# Patient Record
Sex: Female | Born: 1955 | Race: White | Hispanic: No | Marital: Married | Smoking: Never smoker
Health system: Southern US, Community
[De-identification: ages and names within clinical notes are randomized; demographics above are authoritative.]

## PROBLEM LIST (undated history)

## (undated) DIAGNOSIS — F419 Anxiety disorder, unspecified: Secondary | ICD-10-CM

## (undated) DIAGNOSIS — R5381 Other malaise: Secondary | ICD-10-CM

## (undated) DIAGNOSIS — R5383 Other fatigue: Secondary | ICD-10-CM

## (undated) DIAGNOSIS — R519 Headache, unspecified: Secondary | ICD-10-CM

## (undated) DIAGNOSIS — R002 Palpitations: Secondary | ICD-10-CM

## (undated) DIAGNOSIS — R51 Headache: Secondary | ICD-10-CM

## (undated) DIAGNOSIS — I959 Hypotension, unspecified: Secondary | ICD-10-CM

## (undated) DIAGNOSIS — F429 Obsessive-compulsive disorder, unspecified: Secondary | ICD-10-CM

## (undated) HISTORY — DX: Obsessive-compulsive disorder, unspecified: F42.9

## (undated) HISTORY — DX: Other malaise: R53.81

## (undated) HISTORY — DX: Other fatigue: R53.83

## (undated) HISTORY — PX: NO PAST SURGERIES: SHX2092

## (undated) HISTORY — DX: Palpitations: R00.2

## (undated) HISTORY — DX: Hypotension, unspecified: I95.9

## (undated) HISTORY — DX: Headache: R51

## (undated) HISTORY — DX: Anxiety disorder, unspecified: F41.9

## (undated) HISTORY — DX: Headache, unspecified: R51.9

---

## 2000-01-13 ENCOUNTER — Emergency Department (HOSPITAL_COMMUNITY): Admission: EM | Admit: 2000-01-13 | Discharge: 2000-01-13 | Payer: Self-pay | Admitting: *Deleted

## 2000-01-16 ENCOUNTER — Encounter (HOSPITAL_COMMUNITY): Admission: RE | Admit: 2000-01-16 | Discharge: 2000-01-16 | Payer: Self-pay | Admitting: Emergency Medicine

## 2000-01-20 ENCOUNTER — Encounter (HOSPITAL_COMMUNITY): Admission: RE | Admit: 2000-01-20 | Discharge: 2000-02-10 | Payer: Self-pay | Admitting: *Deleted

## 2000-04-09 ENCOUNTER — Encounter: Admission: RE | Admit: 2000-04-09 | Discharge: 2000-04-09 | Payer: Self-pay | Admitting: Gynecology

## 2000-04-09 ENCOUNTER — Encounter: Payer: Self-pay | Admitting: Gynecology

## 2000-04-10 ENCOUNTER — Encounter: Admission: RE | Admit: 2000-04-10 | Discharge: 2000-04-10 | Payer: Self-pay | Admitting: Gynecology

## 2000-04-10 ENCOUNTER — Encounter: Payer: Self-pay | Admitting: Gynecology

## 2000-10-20 ENCOUNTER — Other Ambulatory Visit: Admission: RE | Admit: 2000-10-20 | Discharge: 2000-10-20 | Payer: Self-pay | Admitting: Gynecology

## 2001-04-08 ENCOUNTER — Encounter: Admission: RE | Admit: 2001-04-08 | Discharge: 2001-04-08 | Payer: Self-pay | Admitting: Gynecology

## 2001-04-08 ENCOUNTER — Encounter: Payer: Self-pay | Admitting: Gynecology

## 2001-12-01 ENCOUNTER — Other Ambulatory Visit: Admission: RE | Admit: 2001-12-01 | Discharge: 2001-12-01 | Payer: Self-pay | Admitting: Gynecology

## 2002-04-19 ENCOUNTER — Encounter: Payer: Self-pay | Admitting: Gynecology

## 2002-04-19 ENCOUNTER — Encounter: Admission: RE | Admit: 2002-04-19 | Discharge: 2002-04-19 | Payer: Self-pay | Admitting: Gynecology

## 2002-05-10 ENCOUNTER — Ambulatory Visit (HOSPITAL_COMMUNITY): Admission: RE | Admit: 2002-05-10 | Discharge: 2002-05-10 | Payer: Self-pay | Admitting: Gastroenterology

## 2003-02-02 ENCOUNTER — Other Ambulatory Visit: Admission: RE | Admit: 2003-02-02 | Discharge: 2003-02-02 | Payer: Self-pay | Admitting: Gynecology

## 2003-04-21 ENCOUNTER — Encounter: Admission: RE | Admit: 2003-04-21 | Discharge: 2003-04-21 | Payer: Self-pay | Admitting: Gynecology

## 2003-04-21 ENCOUNTER — Encounter: Payer: Self-pay | Admitting: Gynecology

## 2004-01-10 ENCOUNTER — Emergency Department (HOSPITAL_COMMUNITY): Admission: EM | Admit: 2004-01-10 | Discharge: 2004-01-10 | Payer: Self-pay | Admitting: Emergency Medicine

## 2004-02-13 ENCOUNTER — Other Ambulatory Visit: Admission: RE | Admit: 2004-02-13 | Discharge: 2004-02-13 | Payer: Self-pay | Admitting: Gynecology

## 2004-05-14 ENCOUNTER — Encounter: Admission: RE | Admit: 2004-05-14 | Discharge: 2004-05-14 | Payer: Self-pay | Admitting: Gynecology

## 2004-05-17 ENCOUNTER — Encounter: Admission: RE | Admit: 2004-05-17 | Discharge: 2004-05-17 | Payer: Self-pay | Admitting: Gynecology

## 2005-03-06 ENCOUNTER — Other Ambulatory Visit: Admission: RE | Admit: 2005-03-06 | Discharge: 2005-03-06 | Payer: Self-pay | Admitting: Gynecology

## 2005-05-30 ENCOUNTER — Encounter: Admission: RE | Admit: 2005-05-30 | Discharge: 2005-05-30 | Payer: Self-pay | Admitting: Gynecology

## 2006-06-01 ENCOUNTER — Encounter: Admission: RE | Admit: 2006-06-01 | Discharge: 2006-06-01 | Payer: Self-pay | Admitting: Gynecology

## 2007-05-25 ENCOUNTER — Ambulatory Visit: Payer: Self-pay | Admitting: Hematology & Oncology

## 2007-06-25 ENCOUNTER — Encounter: Admission: RE | Admit: 2007-06-25 | Discharge: 2007-06-25 | Payer: Self-pay | Admitting: Gynecology

## 2007-06-30 LAB — CBC WITH DIFFERENTIAL/PLATELET
BASO%: 1.2 % (ref 0.0–2.0)
Basophils Absolute: 0.1 10*3/uL (ref 0.0–0.1)
EOS%: 2.5 % (ref 0.0–7.0)
Eosinophils Absolute: 0.1 10*3/uL (ref 0.0–0.5)
HCT: 41.5 % (ref 34.8–46.6)
HGB: 14.5 g/dL (ref 11.6–15.9)
LYMPH%: 30.7 % (ref 14.0–48.0)
MCH: 30.5 pg (ref 26.0–34.0)
MCHC: 35 g/dL (ref 32.0–36.0)
MCV: 87.1 fL (ref 81.0–101.0)
MONO#: 0.4 10*3/uL (ref 0.1–0.9)
MONO%: 6 % (ref 0.0–13.0)
NEUT#: 3.6 10*3/uL (ref 1.5–6.5)
NEUT%: 59.7 % (ref 39.6–76.8)
Platelets: 236 10*3/uL (ref 145–400)
RBC: 4.76 10*6/uL (ref 3.70–5.32)
RDW: 10.3 % — ABNORMAL LOW (ref 11.3–14.5)
WBC: 6 10*3/uL (ref 3.9–10.0)
lymph#: 1.9 10*3/uL (ref 0.9–3.3)

## 2007-07-02 LAB — HYPERCOAGULABLE PANEL, COMPREHENSIVE
AntiThromb III Func: 103 % (ref 76–126)
Anticardiolipin IgA: 7 [APL'U] (ref <13)
Anticardiolipin IgG: <7 [GPL'U] (ref <11)
Anticardiolipin IgM: <7 [MPL'U] (ref <10)
Beta-2 Glyco I IgG: <4 U/mL (ref <20)
Beta-2-Glycoprotein I IgA: 5 U/mL (ref <10)
Beta-2-Glycoprotein I IgM: <4 U/mL (ref <10)
DRVVT: 38.1 secs (ref 36.1–47.0)
Homocysteine: 5.9 umol/L (ref 4.0–15.4)
PTT Lupus Anticoagulant: 37.8 secs (ref 36.3–48.8)
Protein C Activity: 75 % (ref 75–133)
Protein C, Total: 50 % — ABNORMAL LOW (ref 70–140)
Protein S Activity: 103 % (ref 69–129)
Protein S Ag, Total: 122 % (ref 70–140)

## 2009-01-30 ENCOUNTER — Encounter: Admission: RE | Admit: 2009-01-30 | Discharge: 2009-01-30 | Payer: Self-pay | Admitting: Gynecology

## 2010-03-14 ENCOUNTER — Encounter: Admission: RE | Admit: 2010-03-14 | Discharge: 2010-03-14 | Payer: Self-pay | Admitting: Gynecology

## 2010-09-15 ENCOUNTER — Encounter: Payer: Self-pay | Admitting: Gynecology

## 2011-02-27 ENCOUNTER — Telehealth: Payer: Self-pay | Admitting: Cardiology

## 2011-02-27 NOTE — Telephone Encounter (Signed)
Fax [phone].  Need most recent ov, ekg, echo, stress test.

## 2011-02-28 ENCOUNTER — Encounter: Payer: Self-pay | Admitting: Cardiology

## 2011-02-28 NOTE — Telephone Encounter (Signed)
Records sent to [facility].

## 2011-03-06 ENCOUNTER — Other Ambulatory Visit: Payer: Self-pay | Admitting: *Deleted

## 2011-03-06 MED ORDER — METOPROLOL SUCCINATE ER 50 MG PO TB24: ORAL_TABLET | ORAL | Status: DC

## 2011-03-06 NOTE — Telephone Encounter (Signed)
Fax received from pharmacy. Refill completed. Jodette Kylar Leonhardt RN  

## 2011-04-04 ENCOUNTER — Other Ambulatory Visit: Payer: Self-pay | Admitting: Gynecology

## 2011-04-04 DIAGNOSIS — Z1231 Encounter for screening mammogram for malignant neoplasm of breast: Secondary | ICD-10-CM

## 2011-05-07 ENCOUNTER — Ambulatory Visit
Admission: RE | Admit: 2011-05-07 | Discharge: 2011-05-07 | Disposition: A | Discharge status: Home / Self Care | Payer: PRIVATE HEALTH INSURANCE | Source: Ambulatory Visit | Attending: Gynecology | Admitting: Gynecology

## 2011-05-07 DIAGNOSIS — Z1231 Encounter for screening mammogram for malignant neoplasm of breast: Secondary | ICD-10-CM

## 2011-05-19 ENCOUNTER — Other Ambulatory Visit: Payer: Self-pay | Admitting: Cardiology

## 2011-05-20 ENCOUNTER — Other Ambulatory Visit: Payer: Self-pay | Admitting: *Deleted

## 2011-05-20 MED ORDER — METOPROLOL SUCCINATE ER 50 MG PO TB24: ORAL_TABLET | ORAL | Status: DC

## 2011-05-20 NOTE — Telephone Encounter (Signed)
Called msg to make yearly app, 30 pills given

## 2011-05-29 ENCOUNTER — Ambulatory Visit (INDEPENDENT_AMBULATORY_CARE_PROVIDER_SITE_OTHER): Payer: PRIVATE HEALTH INSURANCE | Admitting: Cardiology

## 2011-05-29 ENCOUNTER — Encounter: Payer: Self-pay | Admitting: Cardiology

## 2011-05-29 VITALS — BP 110/70 | HR 66 | Ht 63.0 in | Wt 128.0 lb

## 2011-05-29 DIAGNOSIS — F429 Obsessive-compulsive disorder, unspecified: Secondary | ICD-10-CM

## 2011-05-29 DIAGNOSIS — R002 Palpitations: Secondary | ICD-10-CM

## 2011-05-29 HISTORY — DX: Palpitations: R00.2

## 2011-05-29 HISTORY — DX: Obsessive-compulsive disorder, unspecified: F42.9

## 2011-05-29 NOTE — Assessment & Plan Note (Signed)
The patient is followed for this disorder by a psychologist in [place], but will probably transfer her care to Dr. Meredith Staggers in [place]

## 2011-05-29 NOTE — Progress Notes (Signed)
Christine [name] Date of Birth:  14-May-1956 [facility] [address]   [address] [place], [place]  [address] [phone]           Fax   [phone]  HPI: This pleasant 55 year old woman is seen for a one-year followup office visit.  Has a history of palpitations and hyperkinetic heart syndrome.  He does not have any significant structural heart disease.  She had an echocardiogram in 2008, which was normal.  He had normal systolic function with an ejection fraction of 55-60% and normal diastolic function.  She had had a prior her stress test, which had suggested a low ejection fraction.  The the echocardiogram showed new wall motion abnormalities.  She did not have any ischemia on her nuclear stress test.  Current Outpatient Prescriptions  Medication Sig Dispense Refill  . ALPRAZolam (XANAX) 0.25 MG tablet Take 0.25 mg by mouth at bedtime as needed.        Marland Kitchen amphetamine-dextroamphetamine (ADDERALL) 15 MG tablet Take 15 mg by mouth daily. Taking 1/2 daily      . DULoxetine (CYMBALTA) 60 MG capsule Take 60 mg by mouth daily.        Marland Kitchen FLUoxetine (PROZAC) 20 MG tablet Take 20 mg by mouth daily.        . metoprolol (TOPROL XL) 50 MG 24 hr tablet Take one half tablet by mouth daily  30 tablet  0    Allergies  Allergen Reactions  . Iodine     topical    Patient Active Problem List  Diagnoses  . OCD (obsessive compulsive disorder)  . Palpitations    History  Smoking status  . Never Smoker   Smokeless tobacco  . Not on file    History  Alcohol Use: Not on file    No family history on file.  Review of Systems: The patient denies any heat or cold intolerance.  No weight gain or weight loss.  The patient denies headaches or blurry vision.  There is no cough or sputum production.  The patient denies dizziness.  There is no hematuria or hematochezia.  The patient denies any muscle aches or arthritis.  The patient denies any rash.  The patient denies  frequent falling or instability.  There is no history of depression or anxiety.  All other systems were reviewed and are negative.   Physical Exam: Filed Vitals:   05/29/11 0941  BP: 110/70  Pulse: 66   general appearance reveals a well-developed, well-nourished woman in no distress.The head and neck exam reveals pupils equal and reactive.  Extraocular movements are full.  There is no scleral icterus.  The mouth and pharynx are normal.  The neck is supple.  The carotids reveal no bruits.  The jugular venous pressure is normal.  The  thyroid is not enlarged.  There is no lymphadenopathy.  The chest is clear to percussion and auscultation.  There are no rales or rhonchi.  Expansion of the chest is symmetrical.  The precordium is quiet.  The first heart sound is normal.  The second heart sound is physiologically split.  There is no murmur gallop rub or click.  There is no abnormal lift or heave.  The abdomen is soft and nontender.  The bowel sounds are normal.  The liver and spleen are not enlarged.  There are no abdominal masses.  There are no abdominal bruits.  Extremities reveal good pedal pulses.  There is no phlebitis or edema.  There is no cyanosis or clubbing.  Strength is normal and symmetrical in all extremities.  There is no lateralizing weakness.  There are no sensory deficits.  The skin is warm and dry.  There is no rash.      Assessment / Plan: Recheck in one year for office visit and EKG

## 2011-05-29 NOTE — Assessment & Plan Note (Signed)
Patient is on Toprol 25 mg each day.  She is not having any palpitations.  She denied any chest pain.

## 2011-05-29 NOTE — Patient Instructions (Signed)
Your physician wants you to follow-up in: 1 year You will receive a reminder letter in the mail two months in advance. If you don't receive a letter, please call our office to schedule the follow-up appointment.  continue same dose of medications   

## 2011-07-31 ENCOUNTER — Other Ambulatory Visit: Payer: Self-pay | Admitting: Cardiology

## 2012-03-30 ENCOUNTER — Other Ambulatory Visit: Payer: Self-pay | Admitting: Gynecology

## 2012-03-30 DIAGNOSIS — Z1231 Encounter for screening mammogram for malignant neoplasm of breast: Secondary | ICD-10-CM

## 2012-04-07 ENCOUNTER — Other Ambulatory Visit: Payer: Self-pay | Admitting: Cardiology

## 2012-04-07 NOTE — Telephone Encounter (Signed)
Refilled metoprolol 

## 2012-05-10 ENCOUNTER — Ambulatory Visit
Admission: RE | Admit: 2012-05-10 | Discharge: 2012-05-10 | Disposition: A | Discharge status: Home / Self Care | Payer: PRIVATE HEALTH INSURANCE | Source: Ambulatory Visit | Attending: Gynecology | Admitting: Gynecology

## 2012-05-10 DIAGNOSIS — Z1231 Encounter for screening mammogram for malignant neoplasm of breast: Secondary | ICD-10-CM

## 2012-07-07 ENCOUNTER — Encounter: Payer: Self-pay | Admitting: Cardiology

## 2012-07-07 ENCOUNTER — Ambulatory Visit (INDEPENDENT_AMBULATORY_CARE_PROVIDER_SITE_OTHER): Payer: PRIVATE HEALTH INSURANCE | Admitting: Cardiology

## 2012-07-07 VITALS — BP 113/75 | HR 61 | Ht 63.6 in | Wt 130.0 lb

## 2012-07-07 DIAGNOSIS — R5381 Other malaise: Secondary | ICD-10-CM

## 2012-07-07 DIAGNOSIS — R002 Palpitations: Secondary | ICD-10-CM

## 2012-07-07 DIAGNOSIS — R5383 Other fatigue: Secondary | ICD-10-CM | POA: Insufficient documentation

## 2012-07-07 DIAGNOSIS — F429 Obsessive-compulsive disorder, unspecified: Secondary | ICD-10-CM

## 2012-07-07 HISTORY — DX: Other malaise: R53.81

## 2012-07-07 LAB — CBC WITH DIFFERENTIAL/PLATELET
Basophils Absolute: 0 10*3/uL (ref 0.0–0.1)
Basophils Relative: 0.4 % (ref 0.0–3.0)
Eosinophils Absolute: 0.1 10*3/uL (ref 0.0–0.7)
Eosinophils Relative: 2.3 % (ref 0.0–5.0)
HCT: 41.7 % (ref 36.0–46.0)
Hemoglobin: 13.8 g/dL (ref 12.0–15.0)
Lymphocytes Relative: 20.1 % (ref 12.0–46.0)
Lymphs Abs: 1.1 10*3/uL (ref 0.7–4.0)
MCHC: 33.1 g/dL (ref 30.0–36.0)
MCV: 91.6 fl (ref 78.0–100.0)
Monocytes Absolute: 0.6 10*3/uL (ref 0.1–1.0)
Monocytes Relative: 11.9 % (ref 3.0–12.0)
Neutro Abs: 3.5 10*3/uL (ref 1.4–7.7)
Neutrophils Relative %: 65.3 % (ref 43.0–77.0)
Platelets: 260 10*3/uL (ref 150.0–400.0)
RBC: 4.56 Mil/uL (ref 3.87–5.11)
RDW: 13.1 % (ref 11.5–14.6)
WBC: 5.3 10*3/uL (ref 4.5–10.5)

## 2012-07-07 LAB — BASIC METABOLIC PANEL
BUN: 13 mg/dL (ref 6–23)
CO2: 27 mEq/L (ref 19–32)
Calcium: 8.9 mg/dL (ref 8.4–10.5)
Chloride: 101 mEq/L (ref 96–112)
Creatinine, Ser: 0.7 mg/dL (ref 0.4–1.2)
GFR: 95.57 mL/min (ref >60.00)
Glucose, Bld: 80 mg/dL (ref 70–99)
Potassium: 4.2 mEq/L (ref 3.5–5.1)
Sodium: 133 mEq/L — ABNORMAL LOW (ref 135–145)

## 2012-07-07 LAB — HEPATIC FUNCTION PANEL
ALT: 15 U/L (ref 0–35)
AST: 16 U/L (ref 0–37)
Albumin: 3.8 g/dL (ref 3.5–5.2)
Alkaline Phosphatase: 50 U/L (ref 39–117)
Bilirubin, Direct: 0.1 mg/dL (ref 0.0–0.3)
Total Bilirubin: 0.9 mg/dL (ref 0.3–1.2)
Total Protein: 7.1 g/dL (ref 6.0–8.3)

## 2012-07-07 LAB — LIPID PANEL
Cholesterol: 219 mg/dL — ABNORMAL HIGH (ref 0–200)
HDL: 50.4 mg/dL (ref >39.00)
Total CHOL/HDL Ratio: 4
Triglycerides: 85 mg/dL (ref 0.0–149.0)
VLDL: 17 mg/dL (ref 0.0–40.0)

## 2012-07-07 LAB — TSH: TSH: 0.98 u[IU]/mL (ref 0.35–5.50)

## 2012-07-07 LAB — LDL CHOLESTEROL, DIRECT: Direct LDL: 151.8 mg/dL

## 2012-07-07 MED ORDER — METOPROLOL SUCCINATE ER 25 MG PO TB24: 12.5000 mg | ORAL_TABLET | Freq: Every day | ORAL | Status: DC

## 2012-07-07 NOTE — Assessment & Plan Note (Signed)
This is followed closely by her psychiatrist Dr. Meredith Staggers

## 2012-07-07 NOTE — Assessment & Plan Note (Signed)
The patient has not been getting much regular exercise.  She wanted to be sure that her heart was strong enough for exercise.  I reassured her today that it was strong enough.  Her electrocardiogram today remains normal.

## 2012-07-07 NOTE — Progress Notes (Signed)
Christine [name] Date of Birth:  [birth year] [facility] [address] [place], [place]  [address] [phone]         Fax   [phone]  History of Present Illness: This pleasant 56 year old woman is seen for a one-year followup office visit. Has a history of palpitations and hyperkinetic heart syndrome. He does not have any significant structural heart disease. She had an echocardiogram in 2008, which was normal. He had normal systolic function with an ejection fraction of 55-60% and normal diastolic function. She had had a prior nuclear stress test, which had suggested a low ejection fraction. The the echocardiogram showed new wall motion abnormalities. She did not have any ischemia on her nuclear stress test.  Since last visit she has been doing well.  She does have a history of OCD.  Her recent medications have been changed by her psychiatrist.   Current Outpatient Prescriptions  Medication Sig Dispense Refill  . Estradiol (MINIVELLE TD) Place 1 patch onto the skin 2 (two) times a week.      Marland Kitchen FLUoxetine (PROZAC) 20 MG tablet Take 60 mg by mouth daily.       . metoprolol succinate (TOPROL-XL) 25 MG 24 hr tablet Take 0.5 tablets (12.5 mg total) by mouth daily. Take with or immediately following a meal.  45 tablet  3  . progesterone 200 MG SUPP Place 200 mg vaginally once. 1 time every three months      . [DISCONTINUED] metoprolol succinate (TOPROL-XL) 50 MG 24 hr tablet take 1/2 tablet by mouth once daily  30 tablet  3    Allergies  Allergen Reactions  . Iodine     topical    Patient Active Problem List  Diagnosis  . OCD (obsessive compulsive disorder)  . Palpitations  . Malaise and fatigue    History  Smoking status  . Never Smoker   Smokeless tobacco  . Not on file    History  Alcohol Use: Not on file    No family history on file.  Review of Systems: Constitutional: no fever chills diaphoresis or fatigue or change in weight.  Head and neck:  no hearing loss, no epistaxis, no photophobia or visual disturbance. Respiratory: No cough, shortness of breath or wheezing. Cardiovascular: No chest pain peripheral edema, palpitations. Gastrointestinal: No abdominal distention, no abdominal pain, no change in bowel habits hematochezia or melena. Genitourinary: No dysuria, no frequency, no urgency, no nocturia. Musculoskeletal:No arthralgias, no back pain, no gait disturbance or myalgias. Neurological: No dizziness, no headaches, no numbness, no seizures, no syncope, no weakness, no tremors. Hematologic: No lymphadenopathy, no easy bruising. Psychiatric: No confusion, no hallucinations, no sleep disturbance.    Physical Exam: Filed Vitals:   07/07/12 1132  BP: 113/75  Pulse: 61   the general appearance reveals a well-developed slightly anxious woman in no acute distress.The head and neck exam reveals pupils equal and reactive.  Extraocular movements are full.  There is no scleral icterus.  The mouth and pharynx are normal.  The neck is supple.  The carotids reveal no bruits.  The jugular venous pressure is normal.  The  thyroid is not enlarged.  There is no lymphadenopathy.  The chest is clear to percussion and auscultation.  There are no rales or rhonchi.  Expansion of the chest is symmetrical.  The precordium is quiet.  The first heart sound is normal.  The second heart sound is physiologically split.  There is no murmur gallop rub or click.  There is no abnormal lift or heave.  The abdomen is soft and nontender.  The bowel sounds are normal.  The liver and spleen are not enlarged.  There are no abdominal masses.  There are no abdominal bruits.  Extremities reveal good pedal pulses.  There is no phlebitis or edema.  There is no cyanosis or clubbing.  Strength is normal and symmetrical in all extremities.  There is no lateralizing weakness.  There are no sensory deficits.  The skin is warm and dry.  There is no rash.     Assessment /  Plan: Continue same medication except decreased dose of Toprol because of potential interaction with Prozac.  Get fasting lab work today.  Recheck in 6 months for followup office visit and EKG

## 2012-07-07 NOTE — Assessment & Plan Note (Addendum)
The patient complains of lack of energy.  She is now on high-dose Prozac 60 mg daily.  There is a potential interaction with metoprolol.  For this reason we will reduce her Toprol-XL to just half of a 25 mg tablet daily.  Her EKG today shows resting sinus bradycardia of 56.  She has not had any recent lab work and we will look for other causes of malaise and fatigue such as anemia and underactive thyroid and electrolyte imbalance etc.

## 2012-07-07 NOTE — Patient Instructions (Signed)
Your physician recommends that you return for lab work in: cbc/ tsh/ bmet/ hfp/lp  Your physician has recommended you make the following change in your medication:   Reduce toprol xl  to 12.5 mg daily  Your physician wants you to follow-up in: 6 months  You will receive a reminder letter in the mail two months in advance. If you don't receive a letter, please call our office to schedule the follow-up appointment.

## 2012-07-07 NOTE — Progress Notes (Signed)
Quick Note:  Please report to patient. The recent labs are stable. Continue same medication and careful diet. No anemia. Thyroid normal. LDL cholesterol is high. Watch diet, increase exercise. ______

## 2012-07-09 ENCOUNTER — Telehealth: Payer: Self-pay | Admitting: Cardiology

## 2012-07-09 NOTE — Telephone Encounter (Signed)
Advised patient of lab results  

## 2012-07-09 NOTE — Telephone Encounter (Signed)
Message copied by Burnell Blanks on Fri Jul 09, 2012  5:22 PM ------      Message from: Cassell Clement      Created: Wed Jul 07, 2012  8:11 PM       Please report to patient.  The recent labs are stable. Continue same medication and careful diet. No anemia.  Thyroid normal. LDL cholesterol is high. Watch diet, increase exercise.

## 2012-07-09 NOTE — Telephone Encounter (Signed)
Pt calling for blood test results °

## 2013-02-03 ENCOUNTER — Ambulatory Visit (INDEPENDENT_AMBULATORY_CARE_PROVIDER_SITE_OTHER): Payer: BC Managed Care – PPO | Admitting: Cardiology

## 2013-02-03 ENCOUNTER — Encounter: Payer: Self-pay | Admitting: Cardiology

## 2013-02-03 VITALS — BP 118/64 | HR 53 | Ht 63.5 in | Wt 126.8 lb

## 2013-02-03 DIAGNOSIS — F429 Obsessive-compulsive disorder, unspecified: Secondary | ICD-10-CM

## 2013-02-03 DIAGNOSIS — R002 Palpitations: Secondary | ICD-10-CM

## 2013-02-03 NOTE — Assessment & Plan Note (Signed)
Her symptoms of OCD are currently well controlled on present level of Prozac 60 mg daily and she is followed by psychiatry.

## 2013-02-03 NOTE — Assessment & Plan Note (Signed)
Palpitations are well controlled at current level of therapy.  Continue Toprol 12.5 mg daily

## 2013-02-03 NOTE — Progress Notes (Signed)
Christine [name] Date of Birth:  1956/06/18 [facility] [address] [place], [place]  [address] [phone]  Fax   [phone]  HPI: This pleasant 57 year old woman is seen for a six-month followup office visit.  She has a history of palpitations and hyperkinetic heart syndrome.  She does not have any significant structural heart disease. She had an echocardiogram in 2008, which was normal. He had normal systolic function with an ejection fraction of 55-60% and normal diastolic function. She had had a prior nuclear stress test, which had suggested a low ejection fraction. The the echocardiogram showed no wall motion abnormalities. She did not have any ischemia on her nuclear stress test. Since last visit she has been doing well. She does have a history of OCD.  At her last visit we decreased her metoprolol to 12.5 mg daily and she has felt well on the lower dose with no increase in palpitations.   Current Outpatient Prescriptions  Medication Sig Dispense Refill  . Estradiol (MINIVELLE TD) Place 1 patch onto the skin 2 (two) times a week.      Marland Kitchen FLUoxetine (PROZAC) 20 MG tablet Take 60 mg by mouth daily.       . metoprolol succinate (TOPROL-XL) 25 MG 24 hr tablet Take 0.5 tablets (12.5 mg total) by mouth daily. Take with or immediately following a meal.  45 tablet  3  . progesterone 200 MG SUPP Place 200 mg vaginally once. 1 time every three months       No current facility-administered medications for this visit.    Allergies  Allergen Reactions  . Iodine     topical    Patient Active Problem List   Diagnosis Date Noted  . Malaise and fatigue 07/07/2012  . OCD (obsessive compulsive disorder) 05/29/2011  . Palpitations 05/29/2011    History  Smoking status  . Never Smoker   Smokeless tobacco  . Not on file    History  Alcohol Use: Not on file    No family history on file.  Review of Systems: The patient denies any heat or cold intolerance.  No  weight gain or weight loss.  The patient denies headaches or blurry vision.  There is no cough or sputum production.  The patient denies dizziness.  There is no hematuria or hematochezia.  The patient denies any muscle aches or arthritis.  The patient denies any rash.  The patient denies frequent falling or instability.  There is no history of depression or anxiety.  All other systems were reviewed and are negative.   Physical Exam: Filed Vitals:   02/03/13 1028  BP: 118/64  Pulse: 53   the general appearance reveals a well-developed well-nourished woman in no distress.The head and neck exam reveals pupils equal and reactive.  Extraocular movements are full.  There is no scleral icterus.  The mouth and pharynx are normal.  The neck is supple.  The carotids reveal no bruits.  The jugular venous pressure is normal.  The  thyroid is not enlarged.  There is no lymphadenopathy.  The chest is clear to percussion and auscultation.  There are no rales or rhonchi.  Expansion of the chest is symmetrical.  The precordium is quiet.  The first heart sound is normal.  The second heart sound is physiologically split.  There is no murmur gallop rub or click.  There is no abnormal lift or heave.  The abdomen is soft and nontender.  The bowel sounds are normal.  The liver and spleen are not enlarged.  There are no abdominal masses.  There are no abdominal bruits.  Extremities reveal good pedal pulses.  There is no phlebitis or edema.  There is no cyanosis or clubbing.  Strength is normal and symmetrical in all extremities.  There is no lateralizing weakness.  There are no sensory deficits.  The skin is warm and dry.  There is no rash.  EKG shows sinus bradycardia and no ischemic changes.    Assessment / Plan: Continue same dose of medication.  Recheck in one year for followup office visit and EKG.

## 2013-02-03 NOTE — Patient Instructions (Signed)
Your physician recommends that you continue on your current medications as directed. Please refer to the Current Medication list given to you today.   Your physician wants you to follow-up in:1 YEAR  You will receive a reminder letter in the mail two months in advance. If you don't receive a letter, please call our office to schedule the follow-up appointment.  

## 2013-05-09 ENCOUNTER — Other Ambulatory Visit: Payer: Self-pay

## 2013-05-09 DIAGNOSIS — Z1231 Encounter for screening mammogram for malignant neoplasm of breast: Secondary | ICD-10-CM

## 2013-05-31 ENCOUNTER — Ambulatory Visit
Admission: RE | Admit: 2013-05-31 | Discharge: 2013-05-31 | Disposition: A | Discharge status: Home / Self Care | Payer: BC Managed Care – PPO | Source: Ambulatory Visit

## 2013-05-31 DIAGNOSIS — Z1231 Encounter for screening mammogram for malignant neoplasm of breast: Secondary | ICD-10-CM

## 2013-07-17 ENCOUNTER — Other Ambulatory Visit: Payer: Self-pay | Admitting: Cardiology

## 2014-02-22 ENCOUNTER — Encounter: Payer: Self-pay | Admitting: Cardiology

## 2014-02-22 ENCOUNTER — Ambulatory Visit (INDEPENDENT_AMBULATORY_CARE_PROVIDER_SITE_OTHER): Payer: BC Managed Care – PPO | Admitting: Cardiology

## 2014-02-22 VITALS — BP 95/63 | HR 58 | Ht 63.5 in | Wt 123.0 lb

## 2014-02-22 DIAGNOSIS — R5383 Other fatigue: Secondary | ICD-10-CM

## 2014-02-22 DIAGNOSIS — R5381 Other malaise: Secondary | ICD-10-CM

## 2014-02-22 DIAGNOSIS — I9589 Other hypotension: Secondary | ICD-10-CM

## 2014-02-22 DIAGNOSIS — F429 Obsessive-compulsive disorder, unspecified: Secondary | ICD-10-CM

## 2014-02-22 DIAGNOSIS — R002 Palpitations: Secondary | ICD-10-CM

## 2014-02-22 NOTE — Patient Instructions (Addendum)
Increase your salt and water intake  Your physician wants you to follow-up in: 1 year ov You will receive a reminder letter in the mail two months in advance. If you don't receive a letter, please call our office to schedule the follow-up appointment.   Your physician recommends that you continue on your current medications as directed. Please refer to the Current Medication list given to you today.

## 2014-02-22 NOTE — Assessment & Plan Note (Signed)
Her blood pressure today was low.  Every checked it and it was unchanged in the range of 95 systolic.  We will have her increase her dietary salt intake and be sure she drinks plenty of water to help raise her blood pressure.  We cannot go any lower on her metoprolol.

## 2014-02-22 NOTE — Progress Notes (Signed)
Christine JacobSallie G [name] Date of Birth:  Feb 19, 1956 [facility] [address] [place], [place]  [id number] [phone]  Fax   [phone]  HPI: This pleasant 58 year old woman is seen for a one-year followup office visit. She has a history of palpitations and hyperkinetic heart syndrome. She does not have any significant structural heart disease. She had an echocardiogram in 2008, which was normal. He had normal systolic function with an ejection fraction of 55-60% and normal diastolic function. She had had a prior nuclear stress test, which had suggested a low ejection fraction. The the echocardiogram showed no wall motion abnormalities. She did not have any ischemia on her nuclear stress test. Since last visit she has been doing well. She does have a history of OCD. At her last visit we decreased her metoprolol to 12.5 mg daily and she has felt well on the lower dose with no increase in palpitations.   Current Outpatient Prescriptions  Medication Sig Dispense Refill  . Estradiol (MINIVELLE TD) Place 1 patch onto the skin 2 (two) times a week.      Marland Kitchen. FLUoxetine (PROZAC) 10 MG capsule Take 10 mg by mouth every evening. TOTAL OF 70 MG IN THE EVENING      . FLUoxetine (PROZAC) 20 MG tablet Take 60 mg by mouth every evening. TOTAL OF 70 MG IN THE EVENING      . metoprolol succinate (TOPROL-XL) 25 MG 24 hr tablet take 1/2 tablet by mouth once daily in the evening      . progesterone (PROMETRIUM) 200 MG capsule Take 200 mg by mouth as directed. Take 1 capsule for 12 days daily every other month       No current facility-administered medications for this visit.    Allergies  Allergen Reactions  . Iodine     topical    Patient Active Problem List   Diagnosis Date Noted  . Malaise and fatigue 07/07/2012  . OCD (obsessive compulsive disorder) 05/29/2011  . Palpitations 05/29/2011    History  Smoking status  . Never Smoker   Smokeless tobacco  . Not on file    History    Alcohol Use: Not on file    No family history on file.  Review of Systems: The patient denies any heat or cold intolerance.  No weight gain or weight loss.  The patient denies headaches or blurry vision.  There is no cough or sputum production.  The patient denies dizziness.  There is no hematuria or hematochezia.  The patient denies any muscle aches or arthritis.  The patient denies any rash.  The patient denies frequent falling or instability.  There is no history of depression or anxiety.  All other systems were reviewed and are negative.   Physical Exam: Filed Vitals:   02/22/14 0933  BP: 95/63  Pulse: 58   the general appearance reveals a well-developed well-nourished pleasant woman in no distress.The head and neck exam reveals pupils equal and reactive.  Extraocular movements are full.  There is no scleral icterus.  The mouth and pharynx are normal.  The neck is supple.  The carotids reveal no bruits.  The jugular venous pressure is normal.  The  thyroid is not enlarged.  There is no lymphadenopathy.  The chest is clear to percussion and auscultation.  There are no rales or rhonchi.  Expansion of the chest is symmetrical.  The precordium is quiet.  The first heart sound is normal.  The second heart  sound is physiologically split.  There is no murmur gallop rub or click.  There is no abnormal lift or heave.  The abdomen is soft and nontender.  The bowel sounds are normal.  The liver and spleen are not enlarged.  There are no abdominal masses.  There are no abdominal bruits.  Extremities reveal good pedal pulses.  There is no phlebitis or edema.  There is no cyanosis or clubbing.  Strength is normal and symmetrical in all extremities.  There is no lateralizing weakness.  There are no sensory deficits.  The skin is warm and dry.  There is no rash.      Assessment / Plan: 1. palpitations and hyperkinetic heart syndrome--continue beta blocker 2. low blood pressure and malaise and fatigue.   We will have her try to increase her dietary salt intake. 3. OCD  Plan: Recheck in one year for followup office visit and EKG

## 2014-02-22 NOTE — Assessment & Plan Note (Signed)
Her symptoms of OCD have remained stable.  She takes all of her medications at night.

## 2014-02-22 NOTE — Assessment & Plan Note (Signed)
She has not had any sustained palpitations or tachycardia

## 2014-04-14 ENCOUNTER — Other Ambulatory Visit: Payer: Self-pay | Admitting: Cardiology

## 2014-05-26 ENCOUNTER — Other Ambulatory Visit: Payer: Self-pay

## 2014-05-26 DIAGNOSIS — Z1231 Encounter for screening mammogram for malignant neoplasm of breast: Secondary | ICD-10-CM

## 2014-06-16 ENCOUNTER — Ambulatory Visit
Admission: RE | Admit: 2014-06-16 | Discharge: 2014-06-16 | Disposition: A | Discharge status: Home / Self Care | Payer: BC Managed Care – PPO | Source: Ambulatory Visit

## 2014-06-16 DIAGNOSIS — Z1231 Encounter for screening mammogram for malignant neoplasm of breast: Secondary | ICD-10-CM

## 2015-02-13 ENCOUNTER — Other Ambulatory Visit: Payer: Self-pay | Admitting: Cardiology

## 2015-02-23 ENCOUNTER — Ambulatory Visit (INDEPENDENT_AMBULATORY_CARE_PROVIDER_SITE_OTHER): Payer: BLUE CROSS/BLUE SHIELD | Admitting: Cardiology

## 2015-02-23 ENCOUNTER — Encounter: Payer: Self-pay | Admitting: Cardiology

## 2015-02-23 VITALS — BP 92/60 | HR 57 | Ht 63.5 in | Wt 126.8 lb

## 2015-02-23 DIAGNOSIS — R5383 Other fatigue: Secondary | ICD-10-CM

## 2015-02-23 DIAGNOSIS — I959 Hypotension, unspecified: Secondary | ICD-10-CM | POA: Diagnosis not present

## 2015-02-23 DIAGNOSIS — R002 Palpitations: Secondary | ICD-10-CM

## 2015-02-23 DIAGNOSIS — F42 Obsessive-compulsive disorder: Secondary | ICD-10-CM

## 2015-02-23 DIAGNOSIS — F429 Obsessive-compulsive disorder, unspecified: Secondary | ICD-10-CM

## 2015-02-23 DIAGNOSIS — R5381 Other malaise: Secondary | ICD-10-CM | POA: Diagnosis not present

## 2015-02-23 HISTORY — DX: Hypotension, unspecified: I95.9

## 2015-02-23 MED ORDER — METOPROLOL SUCCINATE ER 25 MG PO TB24: ORAL_TABLET | ORAL | Status: DC

## 2015-02-23 NOTE — Progress Notes (Signed)
Cardiology Office Note   Date:  02/23/2015   ID:  Christine JacobSallie G Keshishyan, DOB 05/14/1956, MRN [id number]  PCP:  Gweneth DimitriMCNEILL,WENDY, MD  Cardiologist: Cassell Clementhomas Lesa Vandall MD  No chief complaint on file.     History of Present Illness: Christine [name] is a 59 y.o. female who presents for a one-year follow-up visit.  . She has a history of palpitations and hyperkinetic heart syndrome. She does not have any significant structural heart disease. She had an echocardiogram in 2008, which was normal. He had normal systolic function with an ejection fraction of 55-60% and normal diastolic function. She had had a prior nuclear stress test, which had suggested a low ejection fraction. The the echocardiogram showed no wall motion abnormalities. She did not have any ischemia on her nuclear stress test. Since last visit she has been doing well. She does have a history of OCD. At her last visit we decreased her metoprolol to 12.5 mg daily and she has felt well on the lower dose with no increase in palpitations. She gets her exercise by walking her dog.  She also does exercise tapes at home. History of chronic low blood pressure.  She really is not experiencing any dizziness or syncope.  She is trying to eat more salt. Her malaise and fatigue have improved. Prozac is managed by her psychiatrist Dr. Haywood Lassoaudle No past medical history on file.  No past surgical history on file.   Current Outpatient Prescriptions  Medication Sig Dispense Refill  . Estradiol (MINIVELLE TD) Place 1 patch onto the skin 2 (two) times a week.    Marland Kitchen. FLUoxetine (PROZAC) 20 MG tablet Take 60 mg by mouth daily.    . metoprolol succinate (TOPROL-XL) 25 MG 24 hr tablet take 1/2 tablet by mouth once daily TAKE WITH OR IMMEDIATELY FOLLOWING A MEAL 45 tablet 2  . progesterone (PROMETRIUM) 200 MG capsule Take 200 mg by mouth as directed. Take 1 capsule for 12 days daily every other month     No current facility-administered medications for this visit.      Allergies:   Iodine    Social History:  The patient  reports that she has never smoked. She does not have any smokeless tobacco history on file.   Family History:  The patient's family history is not on file. her brother has WPW   ROS:  Please see the history of present illness.   Otherwise, review of systems are positive for none.   All other systems are reviewed and negative.    PHYSICAL EXAM: VS:  BP 92/60 mmHg  Pulse 57  Ht 5' 3.5" (1.613 m)  Wt 126 lb 12.8 oz (57.516 kg)  BMI 22.11 kg/m2 , BMI Body mass index is 22.11 kg/(m^2). GEN: Well nourished, well developed, in no acute distress HEENT: normal Neck: no JVD, carotid bruits, or masses Cardiac: RRR; no murmurs, rubs, or gallops,no edema  Respiratory:  clear to auscultation bilaterally, normal work of breathing GI: soft, nontender, nondistended, + BS MS: no deformity or atrophy Skin: warm and dry, no rash Neuro:  Strength and sensation are intact Psych: euthymic mood, full affect   EKG:  EKG is ordered today. The ekg ordered today demonstrates normal sinus rhythm.  Within normal limits.   Recent Labs: No results found for requested labs within last 365 days.    Lipid Panel    Component Value Date/Time   CHOL 219* 07/07/2012 1222   TRIG 85.0 07/07/2012 1222   HDL 50.40 07/07/2012  1222   CHOLHDL 4 07/07/2012 1222   VLDL 17.0 07/07/2012 1222   LDLDIRECT 151.8 07/07/2012 1222      Wt Readings from Last 3 Encounters:  02/23/15 126 lb 12.8 oz (57.516 kg)  02/22/14 123 lb (55.792 kg)  02/03/13 126 lb 12.8 oz (57.516 kg)         ASSESSMENT AND PLAN:  1. palpitations and hyperkinetic heart syndrome--continue beta blocker 2. low blood pressure and malaise and fatigue.  Improved on high salt intake 3. OCD  Plan: Continue current medication.  Recheck in one year for followup office visit and EKG   Current medicines are reviewed at length with the patient today.  The patient does not have concerns  regarding medicines.  The following changes have been made:  no change  Labs/ tests ordered today include:  No orders of the defined types were placed in this encounter.       Karie Schwalbe MD 02/23/2015 10:22 AM    [facility] [address] [place], [place], [place]  [address] Phone: [phone]; Fax: [phone]

## 2015-02-23 NOTE — Patient Instructions (Signed)
Medication Instructions:  Your physician recommends that you continue on your current medications as directed. Please refer to the Current Medication list given to you today.  Labwork: none  Testing/Procedures: none  Follow-Up: Your physician wants you to follow-up in: 1 year ov/ekg  You will receive a reminder letter in the mail two months in advance. If you don't receive a letter, please call our office to schedule the follow-up appointment.   Any Other Special Instructions Will Be Listed Below (If Applicable).   

## 2015-05-15 ENCOUNTER — Other Ambulatory Visit: Payer: Self-pay

## 2015-05-15 DIAGNOSIS — Z1231 Encounter for screening mammogram for malignant neoplasm of breast: Secondary | ICD-10-CM

## 2015-06-18 ENCOUNTER — Ambulatory Visit
Admission: RE | Admit: 2015-06-18 | Discharge: 2015-06-18 | Disposition: A | Discharge status: Home / Self Care | Payer: BLUE CROSS/BLUE SHIELD | Source: Ambulatory Visit

## 2015-06-18 DIAGNOSIS — Z1231 Encounter for screening mammogram for malignant neoplasm of breast: Secondary | ICD-10-CM

## 2015-11-27 DIAGNOSIS — Z87440 Personal history of urinary (tract) infections: Secondary | ICD-10-CM | POA: Diagnosis not present

## 2015-12-21 DIAGNOSIS — F422 Mixed obsessional thoughts and acts: Secondary | ICD-10-CM | POA: Diagnosis not present

## 2016-01-24 DIAGNOSIS — F422 Mixed obsessional thoughts and acts: Secondary | ICD-10-CM | POA: Diagnosis not present

## 2016-02-12 DIAGNOSIS — F422 Mixed obsessional thoughts and acts: Secondary | ICD-10-CM | POA: Diagnosis not present

## 2016-03-17 DIAGNOSIS — Z1211 Encounter for screening for malignant neoplasm of colon: Secondary | ICD-10-CM | POA: Diagnosis not present

## 2016-03-17 DIAGNOSIS — Z8371 Family history of colonic polyps: Secondary | ICD-10-CM | POA: Diagnosis not present

## 2016-03-21 DIAGNOSIS — R309 Painful micturition, unspecified: Secondary | ICD-10-CM | POA: Diagnosis not present

## 2016-03-21 DIAGNOSIS — N390 Urinary tract infection, site not specified: Secondary | ICD-10-CM | POA: Diagnosis not present

## 2016-04-04 DIAGNOSIS — D225 Melanocytic nevi of trunk: Secondary | ICD-10-CM | POA: Diagnosis not present

## 2016-04-04 DIAGNOSIS — D224 Melanocytic nevi of scalp and neck: Secondary | ICD-10-CM | POA: Diagnosis not present

## 2016-04-04 DIAGNOSIS — L821 Other seborrheic keratosis: Secondary | ICD-10-CM | POA: Diagnosis not present

## 2016-04-04 DIAGNOSIS — L570 Actinic keratosis: Secondary | ICD-10-CM | POA: Diagnosis not present

## 2016-04-04 DIAGNOSIS — D1801 Hemangioma of skin and subcutaneous tissue: Secondary | ICD-10-CM | POA: Diagnosis not present

## 2016-04-07 ENCOUNTER — Other Ambulatory Visit: Payer: Self-pay

## 2016-04-07 MED ORDER — METOPROLOL SUCCINATE ER 25 MG PO TB24: ORAL_TABLET | ORAL | 0 refills | Status: DC

## 2016-04-22 DIAGNOSIS — F422 Mixed obsessional thoughts and acts: Secondary | ICD-10-CM | POA: Diagnosis not present

## 2016-05-09 DIAGNOSIS — R002 Palpitations: Secondary | ICD-10-CM | POA: Diagnosis not present

## 2016-05-09 DIAGNOSIS — Z23 Encounter for immunization: Secondary | ICD-10-CM | POA: Diagnosis not present

## 2016-05-09 DIAGNOSIS — Z0000 Encounter for general adult medical examination without abnormal findings: Secondary | ICD-10-CM | POA: Diagnosis not present

## 2016-05-09 DIAGNOSIS — R5383 Other fatigue: Secondary | ICD-10-CM | POA: Diagnosis not present

## 2016-05-23 ENCOUNTER — Encounter: Payer: Self-pay | Admitting: Cardiology

## 2016-05-23 ENCOUNTER — Ambulatory Visit (INDEPENDENT_AMBULATORY_CARE_PROVIDER_SITE_OTHER): Payer: BLUE CROSS/BLUE SHIELD | Admitting: Cardiology

## 2016-05-23 VITALS — BP 116/74 | HR 55 | Ht 63.0 in | Wt 134.4 lb

## 2016-05-23 DIAGNOSIS — R002 Palpitations: Secondary | ICD-10-CM | POA: Diagnosis not present

## 2016-05-23 MED ORDER — METOPROLOL SUCCINATE ER 25 MG PO TB24: ORAL_TABLET | ORAL | 3 refills | Status: DC

## 2016-05-23 NOTE — Progress Notes (Signed)
Cardiology Office Note    Date:  05/23/2016   ID:  Christine [name], DOB 1956-07-27, MRN [id number]  PCP:  Christine Dimitri, MD  Cardiologist:   Christine Schultz, MD (Former Christine [name])    History of Present Illness:  Christine [name] is a 60 y.o. female for patient of Christine [name] with palpitations here for follow up.   She is doing very well. No palpitations. Tolerating low-dose metoprolol well. No syncope, bleeding, orthopnea, PND, chest pain, shortness of breath. Her daughter and grandchildren live in [place] over the last several years. She is going to visit.  Christine [name] was normal. NUC normal. OCD - Christine [name].  Low dose metoprolol.     Past Medical History:  Diagnosis Date  . Low blood pressure 02/23/2015  . Malaise and fatigue 07/07/2012  . OCD (obsessive compulsive disorder) 05/29/2011  . Palpitations 05/29/2011    Past Surgical History:  Procedure Laterality Date  . NO PAST SURGERIES      Current Medications: Outpatient Medications Prior to Visit  Medication Sig Dispense Refill  . Estradiol (MINIVELLE TD) Place 1 patch onto the skin 2 (two) times a week.    Marland Kitchen FLUoxetine (PROZAC) 20 MG tablet Take 60 mg by mouth daily.    . progesterone (PROMETRIUM) 200 MG capsule Take 200 mg by mouth as directed. Take 1 capsule for 12 days daily every other month    . metoprolol succinate (TOPROL-XL) 25 MG 24 hr tablet take 1/2 tablet by mouth once daily TAKE WITH OR IMMEDIATELY FOLLOWING A MEAL 45 tablet 0   No facility-administered medications prior to visit.      Allergies:   Iodine   Social History   Social History  . Marital status: Married    Spouse name: N/A  . Number of children: N/A  . Years of education: N/A   Social History Main Topics  . Smoking status: Never Smoker  . Smokeless tobacco: None  . Alcohol use None  . Drug use: Unknown  . Sexual activity: Not Asked   Other Topics Concern  . None   Social History Narrative  . None     Family History:  The  patient's no early CAD. Father had PE.   ROS:   Please see the history of present illness.    ROS All other systems reviewed and are negative.   PHYSICAL EXAM:   VS:  BP 116/74   Pulse (!) 55   Ht 5\' 3"  (1.6 m)   Wt 134 lb 6.4 oz (61 kg)   LMP  (LMP Unknown)   BMI 23.81 kg/m    GEN: Well nourished, well developed, in no acute distress  HEENT: normal  Neck: no JVD, carotid bruits, or masses Cardiac: RRR; no murmurs, rubs, or gallops,no edema  Respiratory:  clear to auscultation bilaterally, normal work of breathing GI: soft, nontender, nondistended, + BS MS: no deformity or atrophy  Skin: warm and dry, no rash Neuro:  Alert and Oriented x 3, Strength and sensation are intact Psych: euthymic mood, full affect  Wt Readings from Last 3 Encounters:  05/23/16 134 lb 6.4 oz (61 kg)  02/23/15 126 lb 12.8 oz (57.5 kg)  02/22/14 123 lb (55.8 kg)      Studies/Labs Reviewed:   EKG:  EKG is ordered today.  05/23/16-sinus bradycardia rate 56 with no other abnormalities. Recent Labs: No results found for requested labs within last 8760 hours.   Lipid Panel    Component Value Date/Time  CHOL 219 (H) 07/07/2012 1222   TRIG 85.0 07/07/2012 1222   HDL 50.40 07/07/2012 1222   CHOLHDL 4 07/07/2012 1222   VLDL 17.0 07/07/2012 1222   LDLDIRECT 151.8 07/07/2012 1222    Additional studies/ records that were reviewed today include:  Prior office notes reviewed. Labs reviewed.     ASSESSMENT:    1. Heart palpitations      PLAN:  In order of problems listed above:  Palpitations  - low dose metoprolol, No recent issues. Doing very well.  Hypotension  - low BP, increased salt, Stable doing well.      Medication Adjustments/Labs and Tests Ordered: Current medicines are reviewed at length with the patient today.  Concerns regarding medicines are outlined above.  Medication changes, Labs and Tests ordered today are listed in the Patient Instructions below. Patient  Instructions  Medication Instructions:  The current medical regimen is effective;  continue present plan and medications.  Follow-Up: Follow up in 1 year with Dr. Anne [name].  You will receive a letter in the mail 2 months before you are due.  Please call us when you receive this letter to schedule your follow up appointment.  If you need a refill on your cardiac medications before your next appointment, please call your pharmacy.  Thank you for choosing [name]!!        Signed, Christine SchultzMark Skains, MD  05/23/2016 10:53 AM    [name] [address], [place], [place]  [address] Phone: [phone]) [phone]; Fax: [phone]) [phone]

## 2016-05-23 NOTE — Patient Instructions (Signed)

## 2016-06-09 ENCOUNTER — Other Ambulatory Visit: Payer: Self-pay | Admitting: Gynecology

## 2016-06-09 DIAGNOSIS — F422 Mixed obsessional thoughts and acts: Secondary | ICD-10-CM | POA: Diagnosis not present

## 2016-06-09 DIAGNOSIS — Z1231 Encounter for screening mammogram for malignant neoplasm of breast: Secondary | ICD-10-CM

## 2016-06-24 DIAGNOSIS — Z23 Encounter for immunization: Secondary | ICD-10-CM | POA: Diagnosis not present

## 2016-06-26 ENCOUNTER — Ambulatory Visit
Admission: RE | Admit: 2016-06-26 | Discharge: 2016-06-26 | Disposition: A | Discharge status: Home / Self Care | Payer: BLUE CROSS/BLUE SHIELD | Source: Ambulatory Visit | Attending: Gynecology | Admitting: Gynecology

## 2016-06-26 DIAGNOSIS — Z1231 Encounter for screening mammogram for malignant neoplasm of breast: Secondary | ICD-10-CM

## 2016-07-21 DIAGNOSIS — F422 Mixed obsessional thoughts and acts: Secondary | ICD-10-CM | POA: Diagnosis not present

## 2016-07-24 DIAGNOSIS — Z23 Encounter for immunization: Secondary | ICD-10-CM | POA: Diagnosis not present

## 2016-08-28 DIAGNOSIS — J329 Chronic sinusitis, unspecified: Secondary | ICD-10-CM | POA: Diagnosis not present

## 2016-09-08 DIAGNOSIS — F422 Mixed obsessional thoughts and acts: Secondary | ICD-10-CM | POA: Diagnosis not present

## 2016-10-02 DIAGNOSIS — F422 Mixed obsessional thoughts and acts: Secondary | ICD-10-CM | POA: Diagnosis not present

## 2016-10-06 DIAGNOSIS — M79669 Pain in unspecified lower leg: Secondary | ICD-10-CM | POA: Diagnosis not present

## 2016-10-27 DIAGNOSIS — F422 Mixed obsessional thoughts and acts: Secondary | ICD-10-CM | POA: Diagnosis not present

## 2016-11-13 DIAGNOSIS — R8290 Unspecified abnormal findings in urine: Secondary | ICD-10-CM | POA: Diagnosis not present

## 2016-11-13 DIAGNOSIS — Z79890 Hormone replacement therapy (postmenopausal): Secondary | ICD-10-CM | POA: Diagnosis not present

## 2016-11-13 DIAGNOSIS — Z780 Asymptomatic menopausal state: Secondary | ICD-10-CM | POA: Diagnosis not present

## 2016-11-13 DIAGNOSIS — Z130 Encounter for screening for diseases of the blood and blood-forming organs and certain disorders involving the immune mechanism: Secondary | ICD-10-CM | POA: Diagnosis not present

## 2016-11-13 DIAGNOSIS — Z6823 Body mass index (BMI) 23.0-23.9, adult: Secondary | ICD-10-CM | POA: Diagnosis not present

## 2016-11-13 DIAGNOSIS — Z1389 Encounter for screening for other disorder: Secondary | ICD-10-CM | POA: Diagnosis not present

## 2016-11-13 DIAGNOSIS — Z01419 Encounter for gynecological examination (general) (routine) without abnormal findings: Secondary | ICD-10-CM | POA: Diagnosis not present

## 2016-11-17 ENCOUNTER — Emergency Department (HOSPITAL_COMMUNITY): Payer: BLUE CROSS/BLUE SHIELD

## 2016-11-17 ENCOUNTER — Encounter (HOSPITAL_COMMUNITY): Payer: Self-pay | Admitting: Emergency Medicine

## 2016-11-17 ENCOUNTER — Emergency Department (HOSPITAL_COMMUNITY)
Admission: EM | Admit: 2016-11-17 | Discharge: 2016-11-18 | Disposition: A | Discharge status: Home / Self Care | Payer: BLUE CROSS/BLUE SHIELD | Attending: Emergency Medicine | Admitting: Emergency Medicine

## 2016-11-17 DIAGNOSIS — R202 Paresthesia of skin: Secondary | ICD-10-CM | POA: Diagnosis not present

## 2016-11-17 DIAGNOSIS — M79602 Pain in left arm: Secondary | ICD-10-CM | POA: Insufficient documentation

## 2016-11-17 DIAGNOSIS — M47812 Spondylosis without myelopathy or radiculopathy, cervical region: Secondary | ICD-10-CM | POA: Insufficient documentation

## 2016-11-17 DIAGNOSIS — R209 Unspecified disturbances of skin sensation: Secondary | ICD-10-CM

## 2016-11-17 DIAGNOSIS — M5412 Radiculopathy, cervical region: Secondary | ICD-10-CM

## 2016-11-17 LAB — BASIC METABOLIC PANEL
Anion gap: 6 (ref 5–15)
BUN: 22 mg/dL — ABNORMAL HIGH (ref 6–20)
CO2: 28 mmol/L (ref 22–32)
Calcium: 9.3 mg/dL (ref 8.9–10.3)
Chloride: 102 mmol/L (ref 101–111)
Creatinine, Ser: 0.78 mg/dL (ref 0.44–1.00)
GFR calc Af Amer: >60 mL/min (ref >60)
GFR calc non Af Amer: >60 mL/min (ref >60)
Glucose, Bld: 88 mg/dL (ref 65–99)
Potassium: 3.7 mmol/L (ref 3.5–5.1)
Sodium: 136 mmol/L (ref 135–145)

## 2016-11-17 LAB — CBC
HCT: 40.9 % (ref 36.0–46.0)
Hemoglobin: 14.5 g/dL (ref 12.0–15.0)
MCH: 31.2 pg (ref 26.0–34.0)
MCHC: 35.5 g/dL (ref 30.0–36.0)
MCV: 88 fL (ref 78.0–100.0)
Platelets: 257 10*3/uL (ref 150–400)
RBC: 4.65 MIL/uL (ref 3.87–5.11)
RDW: 12.8 % (ref 11.5–15.5)
WBC: 5.5 10*3/uL (ref 4.0–10.5)

## 2016-11-17 LAB — I-STAT TROPONIN, ED: Troponin i, poc: 0 ng/mL (ref 0.00–0.08)

## 2016-11-17 NOTE — ED Notes (Signed)
[facility] dispatch notified for need of transport.  

## 2016-11-17 NOTE — ED Triage Notes (Signed)
Patient is complaining of left upper arm pain that radiates to her jaw. Denies nausea, vomiting, shortness of breath, dizziness. Patient alert, oriented, ambulatory.

## 2016-11-17 NOTE — ED Provider Notes (Signed)
[facility]-EMERGENCY DEPT Provider Note   CSN: [id number] Arrival date & time: 11/17/16  1858     History   Chief Complaint Chief Complaint  Patient presents with  . Chest Pain  . Arm Pain    Left    HPI Christine [name] is a 61 y.o. female.  HPI Patient reports that over the course of today she developed a feeling of tingling and numbness along her jaw, her left arm and part of her upper left thigh. She reports it had the sensation of pins and needles. It had been going on for part of the day but then she developed a pain that went into her arm and became concerned and sought care at the emergency department for concern of possible heart related symptoms. Patient did not have any associated chest pain. No dyspnea. No syncope. No visual changes. Once the more intense pain that involved much of her left arm had abated, she still had a prickly pins and needle sensation in it. She reports that the sensation persists somewhat along her jawline and just very slightly on her left upper thigh. She denies any similar episodes.She denies headache. Patient states the only thing she can think of is that she has been doing a more intense workout regimen involving a lot of planks and core strengthening. She reports 4 days ago she did get a pain in her left arm but it seemed to resolve at that time and did not persist. Past Medical History:  Diagnosis Date  . Low blood pressure 02/23/2015  . Malaise and fatigue 07/07/2012  . OCD (obsessive compulsive disorder) 05/29/2011  . Palpitations 05/29/2011    Patient Active Problem List   Diagnosis Date Noted  . Low blood pressure 02/23/2015  . Malaise and fatigue 07/07/2012  . OCD (obsessive compulsive disorder) 05/29/2011  . Palpitations 05/29/2011    Past Surgical History:  Procedure Laterality Date  . NO PAST SURGERIES      OB History    No data available       Home Medications    Prior to Admission medications   Medication Sig Start Date End  Date Taking? Authorizing Provider  estradiol (VIVELLE-DOT) 0.025 MG/24HR Place 1 patch onto the skin 2 (two) times a week.   Yes Historical Provider, MD  FLUoxetine (PROZAC) 20 MG tablet Take 60 mg by mouth every evening.    Yes Historical Provider, MD  metoprolol succinate (TOPROL-XL) 25 MG 24 hr tablet take 1/2 tablet by mouth once daily TAKE WITH OR IMMEDIATELY FOLLOWING A MEAL 05/23/16  Yes Jake BatheMark C Skains, MD  nitrofurantoin, macrocrystal-monohydrate, (MACROBID) 100 MG capsule Take 100 mg by mouth 2 (two) times daily.   Yes Historical Provider, MD    Family History No family history on file.  Social History Social History  Substance Use Topics  . Smoking status: Never Smoker  . Smokeless tobacco: Never Used  . Alcohol use Yes     Comment: Occ     Allergies   Iodine   Review of Systems Review of Systems 10 Systems reviewed and are negative for acute change except as noted in the HPI.  Physical Exam Updated Vital Signs BP 116/71   Pulse (!) 56   Temp 97.5 F (36.4 C) (Oral)   Resp 16   Ht 5\' 3"  (1.6 m)   Wt 130 lb (59 kg)   LMP  (LMP Unknown)   SpO2 99%   BMI 23.03 kg/m   Physical Exam  Constitutional: She  is oriented to person, place, and time. She appears well-developed and well-nourished. No distress.  HENT:  Head: Normocephalic and atraumatic.  Nose: Nose normal.  Mouth/Throat: Oropharynx is clear and moist.  Eyes: Conjunctivae and EOM are normal. Pupils are equal, round, and reactive to light.  Neck: Neck supple.  Cardiovascular: Normal rate and regular rhythm.   No murmur heard. Pulmonary/Chest: Effort normal and breath sounds normal. No respiratory distress.  Abdominal: Soft. There is no tenderness.  Musculoskeletal: Normal range of motion. She exhibits no edema, tenderness or deformity.  Neurological: She is alert and oriented to person, place, and time. No cranial nerve deficit or sensory deficit. She exhibits normal muscle tone. Coordination normal.    Normal finger-nose examination, no pronator drift. Upper lower extremity motor strength 5/5.  Skin: Skin is warm and dry.  Psychiatric: She has a normal mood and affect.  Nursing note and vitals reviewed.    ED Treatments / Results  Labs (all labs ordered are listed, but only abnormal results are displayed) Labs Reviewed  BASIC METABOLIC PANEL - Abnormal; Notable for the following:       Result Value   BUN 22 (*)    All other components within normal limits  CBC  I-STAT TROPOININ, ED    EKG  EKG Interpretation  Date/Time:  Monday November 17 2016 19:12:44 EDT Ventricular Rate:  60 PR Interval:    QRS Duration: 103 QT Interval:  448 QTC Calculation: 448 R Axis:   67 Text Interpretation:  Sinus rhythm RSR' in V1 or V2, probably normal variant normal.no old comparison Confirmed by Donnald Garre, MD, Lebron Conners [id number]) on 11/17/2016 9:03:31 PM       Radiology Dg Chest 2 View  Result Date: 11/17/2016 CLINICAL DATA:  Left upper arm pain and tingling radiating to jaw since last night EXAM: CHEST  2 VIEW COMPARISON:  None FINDINGS: Normal heart size, mediastinal contours, and pulmonary vascularity. Lungs clear. No pleural effusion or pneumothorax. Bones unremarkable. IMPRESSION: No acute abnormalities. Electronically Signed   By: Ulyses Southward M.D.   On: 11/17/2016 19:50    Procedures Procedures (including critical care time)  Medications Ordered in ED Medications - No data to display   Initial Impression / Assessment and Plan / ED Course  I have reviewed the triage vital signs and the nursing notes.  Pertinent labs & imaging results that were available during my care of the patient were reviewed by me and considered in my medical decision making (see chart for details).    Consult: Reviewed with Dr. Loralee Pacas neurology. Based on the distribution of symptoms, unlikely cervical etiology or it can proceed with MRI of brain for CVA or MS call Dr. Loralee Pacas back if MRI positive, if negative,  patient may follow up with outpatient neurology. Consult: Dr. Effie Shy excepts for emergency department to emergency department transport with plan for MRI  Final Clinical Impressions(s) / ED Diagnoses   Final diagnoses:  Paresthesia of left upper and lower extremity  Left arm pain  Facial paresthesia  Patient has new onset unilateral paresthesia involving both left upper and lower extremity. She also has facial involvement. This preceded an onset of left arm pain that then spontaneously resolved with these symptoms remaining. I have very low suspicion for cardiac etiology. Troponin is normal patient's EKG is nonischemic and symptoms are atypical. Concern is for possible neurologic etiology such as CVA or new onset MS. Plan will be to obtain MRI in emergency department. Per consultation with Dr. Loralee Pacas if negative  patient will follow up as an outpatient with neurology. If positive, Dr. Loralee Pacas is to be re-consulted.  New Prescriptions New Prescriptions   No medications on file     Arby Barrette, MD 11/17/16 2312

## 2016-11-18 ENCOUNTER — Encounter (HOSPITAL_COMMUNITY): Payer: Self-pay | Admitting: *Deleted

## 2016-11-18 ENCOUNTER — Emergency Department (HOSPITAL_COMMUNITY): Payer: BLUE CROSS/BLUE SHIELD

## 2016-11-18 DIAGNOSIS — R202 Paresthesia of skin: Secondary | ICD-10-CM | POA: Diagnosis not present

## 2016-11-18 LAB — I-STAT TROPONIN, ED: Troponin i, poc: 0 ng/mL (ref 0.00–0.08)

## 2016-11-18 MED ORDER — METHYLPREDNISOLONE 4 MG PO TBPK: ORAL_TABLET | ORAL | 0 refills | Status: DC

## 2016-11-18 MED ORDER — NAPROXEN 500 MG PO TABS: 500.0000 mg | ORAL_TABLET | Freq: Two times a day (BID) | ORAL | 0 refills | Status: DC

## 2016-11-18 MED ORDER — LORAZEPAM 1 MG PO TABS: 1.0000 mg | ORAL_TABLET | Freq: Once | ORAL | Status: DC

## 2016-11-18 MED ORDER — LORAZEPAM 2 MG/ML IJ SOLN
INTRAMUSCULAR | Status: AC
  Filled 2016-11-18: qty 1

## 2016-11-18 MED ORDER — LORAZEPAM 2 MG/ML IJ SOLN
0.5000 mg | Freq: Once | INTRAMUSCULAR | Status: AC
  Administered 2016-11-18: 0.5 mg via INTRAVENOUS

## 2016-11-18 NOTE — ED Notes (Signed)
Patient transported to MRI 

## 2016-11-18 NOTE — ED Notes (Signed)
Report given to Tiffany, RN with [facility].

## 2016-11-18 NOTE — Discharge Instructions (Signed)
There is no evidence of stroke or heart attack. Follow up with your doctor and the neurologist. Return to the ED if you develop worsening pain, weakness, or any other concerns.

## 2016-11-18 NOTE — ED Provider Notes (Signed)
This is a 61 year old female with history of hyperkinetic heart syndrome who presents to the ED for evaluation of paresthesias. This patient states that for the last 8 hours she has experienced paresthesias in the entire L arm and L leg as well as the chin area. Then several hours ago she also developed pain in the L arm and presented to [facility] where neurology consult requested Brain MRI for neurologic etiology. Transferred to this facility. At interview, she denies any chest pain or dyspnea. No decreased sensation. No significant changes in her symptoms since previous evaluation. On exam, cranial nerves 2-12 intact. 5/5 strength throughout, no ataxia on finger to nose. Romberg neg. Normal gait. No nystagmus.   [facility]   MRI shows no stroke. There are evidence of areas of chronic microvascular ischemia. No evidence of active demyelination. 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 We'll treat for possible cervical radiculopathy.  Patient with equal grip strength and sensation on exam. Delta troponin negative. Follow-up with PCP and neurologist. Return precautions discussed.  I personally performed the services described in this documentation, which was scribed in my presence. The recorded information has been reviewed and is accurate.    Glynn OctaveStephen Telsa Dillavou, MD 11/18/16 [phone]

## 2016-11-20 ENCOUNTER — Encounter: Payer: Self-pay | Admitting: Neurology

## 2016-11-20 ENCOUNTER — Ambulatory Visit (INDEPENDENT_AMBULATORY_CARE_PROVIDER_SITE_OTHER): Payer: BLUE CROSS/BLUE SHIELD | Admitting: Neurology

## 2016-11-20 VITALS — BP 102/58 | HR 60 | Resp 14 | Ht 63.0 in | Wt 134.0 lb

## 2016-11-20 DIAGNOSIS — R202 Paresthesia of skin: Secondary | ICD-10-CM

## 2016-11-20 DIAGNOSIS — R200 Anesthesia of skin: Secondary | ICD-10-CM

## 2016-11-20 NOTE — Progress Notes (Signed)
Subjective:    Patient ID: Christine [name] is a 61 y.o. female.  HPI     Huston FoleySaima Giara Mcgaughey, MD, PhD [facility] [address], [address] [address] [place], [place] [address]  I saw Ms. Christine [name] as a referral from the emergency room. She is a 61 year old right-handed woman with an underlying medical history of palpitations, and OCD, who [facility] on [facility]/17/2016 with new onset left sided paresthesias, affecting her face, arm and leg. She had some numbness in her left arm and leg lasted for about a day, she did also have some left arm pain. She had a brain MRI without contrast as well as cervical spine MRI without contrast on 11/18/2016 which I reviewed: IMPRESSION: MRI head: 1. No acute intracranial abnormality identified. 2. Few nonspecific foci of T2 FLAIR hyperintensity in subcortical white matter probably represent mild chronic microvascular ischemic changes. No specific findings for demyelination.  MRI cervical spine: 1. No acute osseous abnormality or abnormal cord signal. 2. Moderate cervical spondylosis with predominantly discogenic degenerative changes. 3. Multiple levels of mild anterior cord impingement with mild canal stenosis at the C5-6 and C6-7 levels. 4. Multilevel foraminal narrowing moderate at the right C3-4 level, moderate bilaterally at C5-6, and mild at left C6-7.  Other than mild neck discomfort, she feels normal. She Denies any headache. She had no slurring of speech or droopy face. She has a remote history of migraines between the ages of [age] and 89. She had, coincidentally recently been treated for urinary tract infection and took one day of Macrobid. She stopped the medication. She also had a routine dental appointment 2 days before her symptoms started, also she did exercise 2 days before her symptoms started including performing a left-sided plank which caused her to have a sudden jolt of pain in her left  arm at the time but she did not think any further about it afterwards. She has no significant vascular risk factors. She is not under any particular amount of stress lately. She is a nonsmoker and drinks alcohol about 3 times a week, does not drink caffeine on a daily basis. She has 3 grown children, 2 in town and one in [place]. She has been on her medications for long time including Prozac which was recently increased to 60 mg to help with her OCD symptoms.  Her Past Medical History Is Significant For: Past Medical History:  Diagnosis Date  . Anxiety disorder   . Headache   . Low blood pressure 02/23/2015  . Malaise and fatigue 07/07/2012  . OCD (obsessive compulsive disorder) 05/29/2011  . Palpitations 05/29/2011    Her Past Surgical History Is Significant For: Past Surgical History:  Procedure Laterality Date  . CESAREAN SECTION    . NO PAST SURGERIES      Her Family History Is Significant For: Family History  Problem Relation Age of Onset  . Alzheimer's disease Mother   . Pulmonary embolism Father   . Anxiety disorder Sister   . Diabetes Maternal Grandmother   . Parkinson's disease Maternal Grandmother     Her Social History Is Significant For: Social History   Social History  . Marital status: Married    Spouse name: N/A  . Number of children: 3  . Years of education: Masters    Occupational History  . N/A    Social History Main Topics  . Smoking status: Never Smoker  . Smokeless tobacco: Never Used  .  Alcohol use Yes     Comment: Occ  . Drug use: No  . Sexual activity: Not Asked   Other Topics Concern  . None   Social History Narrative   Rare caffeine use     Her Allergies Are:  Allergies  Allergen Reactions  . Iodine Hives    Topical/ hives  :   Her Current Medications Are:  Outpatient Encounter Prescriptions as of 11/20/2016  Medication Sig  . estradiol (VIVELLE-DOT) 0.025 MG/24HR Place 1 patch onto the skin 2 (two) times a week.  Marland Kitchen FLUoxetine  (PROZAC) 20 MG tablet Take 60 mg by mouth every evening.   . metoprolol succinate (TOPROL-XL) 25 MG 24 hr tablet take 1/2 tablet by mouth once daily TAKE WITH OR IMMEDIATELY FOLLOWING A MEAL  . progesterone (PROMETRIUM) 200 MG capsule TAKE 1 TABLET AT BEDTIME FOR 14 DAYS EVERY OTHER MONTH  . [DISCONTINUED] methylPREDNISolone (MEDROL DOSEPAK) 4 MG TBPK tablet As directed  . [DISCONTINUED] naproxen (NAPROSYN) 500 MG tablet Take 1 tablet (500 mg total) by mouth 2 (two) times daily.  . [DISCONTINUED] nitrofurantoin, macrocrystal-monohydrate, (MACROBID) 100 MG capsule Take 100 mg by mouth 2 (two) times daily.   No facility-administered encounter medications on file as of 11/20/2016.   :   Review of Systems:  Out of a complete 14 point review of systems, all are reviewed and negative with the exception of these symptoms as listed below: Review of Systems  Neurological:       Patient states that this past Sunday she started having L side jaw numbness that lasted until Monday. Also had some numbness in L arm and L leg.  Recently started Macrobid.  Started having L arm pain on Monday. All symptoms have resolved.  Patient reports that she does exercise and had some of the same symptoms while doing a L sided plank.     Objective:  Neurologic Exam  Physical Exam Physical Examination:   Vitals:   11/20/16 1359  BP: (!) 102/58  Pulse: 60  Resp: 14    General Examination: The patient is a very pleasant 61 y.o. female in no acute distress. She appears well-developed and well-nourished and very well groomed.   HEENT: Normocephalic, atraumatic, pupils are equal, round and reactive to light and accommodation. Funduscopic exam is normal with sharp disc margins noted. Extraocular tracking is good without limitation to gaze excursion or nystagmus noted. Normal smooth pursuit is noted. Hearing is grossly intact. Face is symmetric with normal facial animation and normal facial sensation. Speech is clear  with no dysarthria noted. There is no hypophonia. There is no lip, neck/head, jaw or voice tremor. Neck is supple with full range of passive and active motion. There are no carotid bruits on auscultation. Oropharynx exam reveals: mild mouth dryness, good dental hygiene and mild airway crowding. Mallampati is class I. Tongue protrudes centrally and palate elevates symmetrically.  Chest: Clear to auscultation without wheezing, rhonchi or crackles noted.  Heart: S1+S2+0, regular and normal without murmurs, rubs or gallops noted.   Abdomen: Soft, non-tender and non-distended with normal bowel sounds appreciated on auscultation.  Extremities: There is no pitting edema in the distal lower extremities bilaterally. Pedal pulses are intact.  Skin: Warm and dry without trophic changes noted. There are no varicose veins.  Musculoskeletal: exam reveals no obvious joint deformities, tenderness or joint swelling or erythema.   Neurologically:  Mental status: The patient is awake, alert and oriented in all 4 spheres. Her immediate and remote memory, attention,  language skills and fund of knowledge are appropriate. There is no evidence of aphasia, agnosia, apraxia or anomia. Speech is clear with normal prosody and enunciation. Thought process is linear. Mood is normal and affect is normal.  Cranial nerves II - XII are as described above under HEENT exam. In addition: shoulder shrug is normal with equal shoulder height noted. Motor exam: Normal bulk, strength and tone is noted. There is no drift, tremor or rebound. Romberg is negative. Reflexes are 2+ throughout. Babinski: Toes are flexor bilaterally. Fine motor skills and coordination: intact with normal finger taps, normal hand movements, normal rapid alternating patting, normal foot taps and normal foot agility.  Cerebellar testing: No dysmetria or intention tremor on finger to nose testing. Heel to shin is unremarkable bilaterally. There is no truncal or gait  ataxia.  Sensory exam: intact to light touch, pinprick, vibration, temperature sense in the upper and lower extremities.  Gait, station and balance: She stands easily. No veering to one side is noted. No leaning to one side is noted. Posture is age-appropriate and stance is narrow based. Gait shows normal stride length and normal pace. No problems turning are noted. Tandem walk is unremarkable.   Assessment and Plan:   In summary, ROSALEIGH BRAZZEL is a very pleasant 61 y.o.-year old female with an underlying medical history of palpitations, and OCD, who presented to the emergency room at [facility] on 11/17/2016 with new onset left sided paresthesias, affecting her face, arm and leg. She had some numbness in her left arm and leg lasted for about a day, she did also have some left arm pain.Symptoms lasted for about a day, she feels back to baseline. She has minimal stroke risk factors. She has a very remote history of migraines. She feels no telltale symptoms of radicular pain on a day-to-day basis, has occasional neck pain. She has been exercising on a regular basis. She also had a recent treatment for UTI with a new antibiotic which since then has been stopped. Her cervical spine MRI showed evidence of degenerative disc disease but no cord compression, no severe neuroforaminal narrowing or spinal stenosis. She is advised to monitor his symptoms in that regard. Brain MRI was age-appropriate and negative for any structural lesion or stroke. Reassuringly, neurological exam is normal. She is advised to monitor her symptoms. We talked about potentially ordering an EMG and nerve conduction velocity test if need be but for now we completed by ear. She is in agreement. She is advised to stay active mentally and physically, well rested, and well-hydrated. I will see her back on an as-needed basis. I answered all her questions today and she was in agreement.

## 2016-11-20 NOTE — Patient Instructions (Addendum)
We can consider an EMG and nerve conduction velocity test, which is an electrical nerve and muscle test, which we will schedule, if your symptoms should recur. We can for now, play it by ear.  Some antibiotics cause tingling.   Your exam looks good. MRI brain was benign.

## 2016-12-22 DIAGNOSIS — F422 Mixed obsessional thoughts and acts: Secondary | ICD-10-CM | POA: Diagnosis not present

## 2017-03-26 DIAGNOSIS — R197 Diarrhea, unspecified: Secondary | ICD-10-CM | POA: Diagnosis not present

## 2017-04-15 DIAGNOSIS — F422 Mixed obsessional thoughts and acts: Secondary | ICD-10-CM | POA: Diagnosis not present

## 2017-05-11 DIAGNOSIS — E784 Other hyperlipidemia: Secondary | ICD-10-CM | POA: Diagnosis not present

## 2017-05-11 DIAGNOSIS — E559 Vitamin D deficiency, unspecified: Secondary | ICD-10-CM | POA: Diagnosis not present

## 2017-05-11 DIAGNOSIS — L659 Nonscarring hair loss, unspecified: Secondary | ICD-10-CM | POA: Diagnosis not present

## 2017-05-11 DIAGNOSIS — Z0000 Encounter for general adult medical examination without abnormal findings: Secondary | ICD-10-CM | POA: Diagnosis not present

## 2017-05-11 DIAGNOSIS — Z23 Encounter for immunization: Secondary | ICD-10-CM | POA: Diagnosis not present

## 2017-05-11 DIAGNOSIS — Z131 Encounter for screening for diabetes mellitus: Secondary | ICD-10-CM | POA: Diagnosis not present

## 2017-05-11 DIAGNOSIS — Z13220 Encounter for screening for lipoid disorders: Secondary | ICD-10-CM | POA: Diagnosis not present

## 2017-05-11 DIAGNOSIS — Z79899 Other long term (current) drug therapy: Secondary | ICD-10-CM | POA: Diagnosis not present

## 2017-06-05 DIAGNOSIS — D2222 Melanocytic nevi of left ear and external auricular canal: Secondary | ICD-10-CM | POA: Diagnosis not present

## 2017-06-05 DIAGNOSIS — L650 Telogen effluvium: Secondary | ICD-10-CM | POA: Diagnosis not present

## 2017-06-05 DIAGNOSIS — L819 Disorder of pigmentation, unspecified: Secondary | ICD-10-CM | POA: Diagnosis not present

## 2017-06-05 DIAGNOSIS — D2221 Melanocytic nevi of right ear and external auricular canal: Secondary | ICD-10-CM | POA: Diagnosis not present

## 2017-06-10 ENCOUNTER — Other Ambulatory Visit: Payer: Self-pay | Admitting: Cardiology

## 2017-06-15 DIAGNOSIS — F422 Mixed obsessional thoughts and acts: Secondary | ICD-10-CM | POA: Diagnosis not present

## 2017-06-18 ENCOUNTER — Encounter: Payer: Self-pay | Admitting: Cardiology

## 2017-06-18 ENCOUNTER — Ambulatory Visit (INDEPENDENT_AMBULATORY_CARE_PROVIDER_SITE_OTHER): Payer: BLUE CROSS/BLUE SHIELD | Admitting: Cardiology

## 2017-06-18 VITALS — BP 98/60 | HR 58 | Ht 63.5 in | Wt 131.0 lb

## 2017-06-18 DIAGNOSIS — R002 Palpitations: Secondary | ICD-10-CM | POA: Diagnosis not present

## 2017-06-18 DIAGNOSIS — E7800 Pure hypercholesterolemia, unspecified: Secondary | ICD-10-CM | POA: Diagnosis not present

## 2017-06-18 NOTE — Patient Instructions (Signed)

## 2017-06-18 NOTE — Progress Notes (Signed)
Cardiology Office Note    Date:  06/18/2017   ID:  Christine JacobSallie G Mckelvin, DOB 1956-02-06, MRN [id number]  PCP:  Gweneth DimitriMcNeill, Wendy, MD  Cardiologist:   Donato SchultzMark Jessicah Croll, MD (Former Patty SermonsBrackbill)    History of Present Illness:  Christine [name] is a 61 y.o. female for patient of Dr. Yevonne PaxBrackbill's with palpitations here for follow up.   Her daughter and grandchildren live in [place] over the last several years.   ECHO 2008 was normal. NUC normal. OCD - Dr. Haywood Lassoaudle.  Low dose metoprolol.   She showed me her last lipid result with LDL greater than 190.  Atorvastatin 10 mg was advised.  I concur.  We discussed potential side effects.  Denies any chest pain, syncope, bleeding, orthopnea.    Past Medical History:  Diagnosis Date  . Anxiety disorder   . Headache   . Low blood pressure 02/23/2015  . Malaise and fatigue 07/07/2012  . OCD (obsessive compulsive disorder) 05/29/2011  . Palpitations 05/29/2011    Past Surgical History:  Procedure Laterality Date  . CESAREAN SECTION    . NO PAST SURGERIES      Current Medications: Outpatient Medications Prior to Visit  Medication Sig Dispense Refill  . FLUoxetine (PROZAC) 20 MG tablet Take 60 mg by mouth every evening.     . metoprolol succinate (TOPROL-XL) 25 MG 24 hr tablet TAKE 1/2 TABLET BY MOUTH EVERY DAY WITH A MEAL 15 tablet 0  . progesterone (PROMETRIUM) 200 MG capsule TAKE 1 TABLET AT BEDTIME FOR 14 DAYS EVERY OTHER MONTH  6  . estradiol (VIVELLE-DOT) 0.025 MG/24HR Place 1 patch onto the skin 2 (two) times a week.     No facility-administered medications prior to visit.      Allergies:   Iodine   Social History   Social History  . Marital status: Married    Spouse name: N/A  . Number of children: 3  . Years of education: Masters    Occupational History  . N/A    Social History Main Topics  . Smoking status: Never Smoker  . Smokeless tobacco: Never Used  . Alcohol use Yes     Comment: Occ  . Drug use: No  . Sexual activity: Not  Asked   Other Topics Concern  . None   Social History Narrative   Rare caffeine use      Family History:  The patient's no early CAD. Father had PE.   ROS:   Please see the history of present illness.    ROS All other systems reviewed and are negative.   PHYSICAL EXAM:   VS:  BP 98/60   Pulse (!) 58   Ht 5' 3.5" (1.613 m)   Wt 131 lb (59.4 kg)   LMP  (LMP Unknown)   BMI 22.84 kg/m    GEN: Well nourished, well developed, in no acute distress  HEENT: normal  Neck: no JVD, carotid bruits, or masses Cardiac: RRR; no murmurs, rubs, or gallops,no edema  Respiratory:  clear to auscultation bilaterally, normal work of breathing GI: soft, nontender, nondistended, + BS MS: no deformity or atrophy  Skin: warm and dry, no rash Neuro:  Alert and Oriented x 3, Strength and sensation are intact Psych: euthymic mood, full affect   Wt Readings from Last 3 Encounters:  06/18/17 131 lb (59.4 kg)  11/20/16 134 lb (60.8 kg)  11/17/16 130 lb (59 kg)      Studies/Labs Reviewed:   EKG:  EKG is ordered  today.  05/23/16-sinus bradycardia rate 56 with no other abnormalities. Recent Labs: 11/17/2016: BUN 22; Creatinine, Ser 0.78; Hemoglobin 14.5; Platelets 257; Potassium 3.7; Sodium 136   Lipid Panel    Component Value Date/Time   CHOL 219 (H) 07/07/2012 1222   TRIG 85.0 07/07/2012 1222   HDL 50.40 07/07/2012 1222   CHOLHDL 4 07/07/2012 1222   VLDL 17.0 07/07/2012 1222   LDLDIRECT 151.8 07/07/2012 1222    Additional studies/ records that were reviewed today include:  Prior office notes reviewed. Labs reviewed.     ASSESSMENT:    1. Heart palpitations   2. Pure hypercholesterolemia      PLAN:  In order of problems listed above:  Palpitations  - low dose metoprolol, No recent issues. Doing very well.  No changes made.  Hypotension  - low BP, increased salt, Stable doing well.  No changes made.  Hyperlipidemia  -LDL during last checked by Dr. Uvaldo Rising was greater than  190.  She advised her to take atorvastatin 10 mg once a day.  I concur.  Continue with diet, exercise.  Plant-based diets can be helpful in this situation.  Interestingly, her daughter who lives in [place] also has highly elevated cholesterol.  Likely familial hyperlipidemia.    Medication Adjustments/Labs and Tests Ordered: Current medicines are reviewed at length with the patient today.  Concerns regarding medicines are outlined above.  Medication changes, Labs and Tests ordered today are listed in the Patient Instructions below. Patient Instructions  Medication Instructions:  The current medical regimen is effective;  continue present plan and medications.  Follow-Up: Follow up in 1 year with Dr. Anne Fu.  You will receive a letter in the mail 2 months before you are due.  Please call us when you receive this letter to schedule your follow up appointment.  If you need a refill on your cardiac medications before your next appointment, please call your pharmacy.  Thank you for choosing [facility]!!        Signed, Donato Schultz, MD  06/18/2017 11:16 AM    [facility] [address] [place], [place], [place]  [address] Phone: [phone]; Fax: [phone]

## 2017-07-10 ENCOUNTER — Other Ambulatory Visit: Payer: Self-pay | Admitting: Cardiology

## 2017-07-24 ENCOUNTER — Other Ambulatory Visit: Payer: Self-pay | Admitting: Gynecology

## 2017-07-24 DIAGNOSIS — Z1231 Encounter for screening mammogram for malignant neoplasm of breast: Secondary | ICD-10-CM

## 2017-08-10 DIAGNOSIS — F422 Mixed obsessional thoughts and acts: Secondary | ICD-10-CM | POA: Diagnosis not present

## 2017-08-26 ENCOUNTER — Ambulatory Visit
Admission: RE | Admit: 2017-08-26 | Discharge: 2017-08-26 | Disposition: A | Discharge status: Home / Self Care | Payer: BLUE CROSS/BLUE SHIELD | Source: Ambulatory Visit | Attending: Gynecology | Admitting: Gynecology

## 2017-08-26 DIAGNOSIS — Z1231 Encounter for screening mammogram for malignant neoplasm of breast: Secondary | ICD-10-CM

## 2017-09-28 DIAGNOSIS — J0140 Acute pansinusitis, unspecified: Secondary | ICD-10-CM | POA: Diagnosis not present

## 2017-10-05 DIAGNOSIS — F422 Mixed obsessional thoughts and acts: Secondary | ICD-10-CM | POA: Diagnosis not present

## 2017-10-06 DIAGNOSIS — L659 Nonscarring hair loss, unspecified: Secondary | ICD-10-CM | POA: Diagnosis not present

## 2017-10-06 DIAGNOSIS — Z79899 Other long term (current) drug therapy: Secondary | ICD-10-CM | POA: Diagnosis not present

## 2017-10-28 DIAGNOSIS — H538 Other visual disturbances: Secondary | ICD-10-CM | POA: Diagnosis not present

## 2017-10-28 DIAGNOSIS — Z23 Encounter for immunization: Secondary | ICD-10-CM | POA: Diagnosis not present

## 2017-10-28 DIAGNOSIS — M79644 Pain in right finger(s): Secondary | ICD-10-CM | POA: Diagnosis not present

## 2017-10-29 DIAGNOSIS — H47012 Ischemic optic neuropathy, left eye: Secondary | ICD-10-CM | POA: Diagnosis not present

## 2017-10-30 ENCOUNTER — Encounter (INDEPENDENT_AMBULATORY_CARE_PROVIDER_SITE_OTHER): Payer: BLUE CROSS/BLUE SHIELD | Admitting: Ophthalmology

## 2017-10-30 DIAGNOSIS — H35033 Hypertensive retinopathy, bilateral: Secondary | ICD-10-CM

## 2017-10-30 DIAGNOSIS — H43813 Vitreous degeneration, bilateral: Secondary | ICD-10-CM | POA: Diagnosis not present

## 2017-10-30 DIAGNOSIS — H2513 Age-related nuclear cataract, bilateral: Secondary | ICD-10-CM

## 2017-10-30 DIAGNOSIS — H4602 Optic papillitis, left eye: Secondary | ICD-10-CM | POA: Diagnosis not present

## 2017-10-30 DIAGNOSIS — I10 Essential (primary) hypertension: Secondary | ICD-10-CM | POA: Diagnosis not present

## 2017-10-30 DIAGNOSIS — H47012 Ischemic optic neuropathy, left eye: Secondary | ICD-10-CM | POA: Diagnosis not present

## 2017-10-30 DIAGNOSIS — H4710 Unspecified papilledema: Secondary | ICD-10-CM | POA: Diagnosis not present

## 2017-11-06 DIAGNOSIS — R002 Palpitations: Secondary | ICD-10-CM | POA: Diagnosis not present

## 2017-11-06 DIAGNOSIS — Z79899 Other long term (current) drug therapy: Secondary | ICD-10-CM | POA: Diagnosis not present

## 2017-11-06 DIAGNOSIS — E785 Hyperlipidemia, unspecified: Secondary | ICD-10-CM | POA: Diagnosis not present

## 2017-11-06 DIAGNOSIS — E559 Vitamin D deficiency, unspecified: Secondary | ICD-10-CM | POA: Diagnosis not present

## 2017-11-09 DIAGNOSIS — H47012 Ischemic optic neuropathy, left eye: Secondary | ICD-10-CM | POA: Diagnosis not present

## 2017-11-09 DIAGNOSIS — H2513 Age-related nuclear cataract, bilateral: Secondary | ICD-10-CM | POA: Diagnosis not present

## 2017-11-13 DIAGNOSIS — E559 Vitamin D deficiency, unspecified: Secondary | ICD-10-CM | POA: Diagnosis not present

## 2017-11-13 DIAGNOSIS — N951 Menopausal and female climacteric states: Secondary | ICD-10-CM | POA: Diagnosis not present

## 2017-11-13 DIAGNOSIS — H47012 Ischemic optic neuropathy, left eye: Secondary | ICD-10-CM | POA: Diagnosis not present

## 2017-11-13 DIAGNOSIS — R002 Palpitations: Secondary | ICD-10-CM | POA: Diagnosis not present

## 2017-12-28 DIAGNOSIS — F422 Mixed obsessional thoughts and acts: Secondary | ICD-10-CM | POA: Diagnosis not present

## 2018-02-11 DIAGNOSIS — K224 Dyskinesia of esophagus: Secondary | ICD-10-CM | POA: Diagnosis not present

## 2018-03-22 DIAGNOSIS — F422 Mixed obsessional thoughts and acts: Secondary | ICD-10-CM | POA: Diagnosis not present

## 2018-05-03 DIAGNOSIS — H47012 Ischemic optic neuropathy, left eye: Secondary | ICD-10-CM | POA: Diagnosis not present

## 2018-05-03 DIAGNOSIS — H2513 Age-related nuclear cataract, bilateral: Secondary | ICD-10-CM | POA: Diagnosis not present

## 2018-05-04 DIAGNOSIS — Z130 Encounter for screening for diseases of the blood and blood-forming organs and certain disorders involving the immune mechanism: Secondary | ICD-10-CM | POA: Diagnosis not present

## 2018-05-04 DIAGNOSIS — Z1389 Encounter for screening for other disorder: Secondary | ICD-10-CM | POA: Diagnosis not present

## 2018-05-04 DIAGNOSIS — Z01419 Encounter for gynecological examination (general) (routine) without abnormal findings: Secondary | ICD-10-CM | POA: Diagnosis not present

## 2018-05-04 DIAGNOSIS — Z6822 Body mass index (BMI) 22.0-22.9, adult: Secondary | ICD-10-CM | POA: Diagnosis not present

## 2018-05-04 DIAGNOSIS — Z79890 Hormone replacement therapy (postmenopausal): Secondary | ICD-10-CM | POA: Diagnosis not present

## 2018-05-04 DIAGNOSIS — Z780 Asymptomatic menopausal state: Secondary | ICD-10-CM | POA: Diagnosis not present

## 2018-05-20 DIAGNOSIS — E559 Vitamin D deficiency, unspecified: Secondary | ICD-10-CM | POA: Diagnosis not present

## 2018-05-20 DIAGNOSIS — E785 Hyperlipidemia, unspecified: Secondary | ICD-10-CM | POA: Diagnosis not present

## 2018-05-25 DIAGNOSIS — E559 Vitamin D deficiency, unspecified: Secondary | ICD-10-CM | POA: Diagnosis not present

## 2018-05-25 DIAGNOSIS — E785 Hyperlipidemia, unspecified: Secondary | ICD-10-CM | POA: Diagnosis not present

## 2018-05-25 DIAGNOSIS — Z0001 Encounter for general adult medical examination with abnormal findings: Secondary | ICD-10-CM | POA: Diagnosis not present

## 2018-05-25 DIAGNOSIS — R319 Hematuria, unspecified: Secondary | ICD-10-CM | POA: Diagnosis not present

## 2018-05-25 DIAGNOSIS — R002 Palpitations: Secondary | ICD-10-CM | POA: Diagnosis not present

## 2018-06-29 ENCOUNTER — Ambulatory Visit: Payer: Self-pay | Admitting: Psychiatry

## 2018-07-05 DIAGNOSIS — D1801 Hemangioma of skin and subcutaneous tissue: Secondary | ICD-10-CM | POA: Diagnosis not present

## 2018-07-05 DIAGNOSIS — L820 Inflamed seborrheic keratosis: Secondary | ICD-10-CM | POA: Diagnosis not present

## 2018-07-05 DIAGNOSIS — D225 Melanocytic nevi of trunk: Secondary | ICD-10-CM | POA: Diagnosis not present

## 2018-07-05 DIAGNOSIS — D485 Neoplasm of uncertain behavior of skin: Secondary | ICD-10-CM | POA: Diagnosis not present

## 2018-07-05 DIAGNOSIS — D2271 Melanocytic nevi of right lower limb, including hip: Secondary | ICD-10-CM | POA: Diagnosis not present

## 2018-07-05 DIAGNOSIS — L650 Telogen effluvium: Secondary | ICD-10-CM | POA: Diagnosis not present

## 2018-07-17 ENCOUNTER — Other Ambulatory Visit: Payer: Self-pay | Admitting: Cardiology

## 2018-07-19 ENCOUNTER — Other Ambulatory Visit: Payer: Self-pay | Admitting: Gynecology

## 2018-07-19 DIAGNOSIS — Z1231 Encounter for screening mammogram for malignant neoplasm of breast: Secondary | ICD-10-CM

## 2018-07-20 DIAGNOSIS — J019 Acute sinusitis, unspecified: Secondary | ICD-10-CM | POA: Diagnosis not present

## 2018-08-16 ENCOUNTER — Other Ambulatory Visit: Payer: Self-pay | Admitting: Cardiology

## 2018-08-24 DIAGNOSIS — J0140 Acute pansinusitis, unspecified: Secondary | ICD-10-CM | POA: Diagnosis not present

## 2018-08-24 DIAGNOSIS — E785 Hyperlipidemia, unspecified: Secondary | ICD-10-CM | POA: Diagnosis not present

## 2018-08-24 DIAGNOSIS — Z23 Encounter for immunization: Secondary | ICD-10-CM | POA: Diagnosis not present

## 2018-08-24 DIAGNOSIS — M791 Myalgia, unspecified site: Secondary | ICD-10-CM | POA: Diagnosis not present

## 2018-08-30 ENCOUNTER — Ambulatory Visit
Admission: RE | Admit: 2018-08-30 | Discharge: 2018-08-30 | Disposition: A | Discharge status: Home / Self Care | Payer: BLUE CROSS/BLUE SHIELD | Source: Ambulatory Visit | Attending: Gynecology | Admitting: Gynecology

## 2018-08-30 DIAGNOSIS — Z1231 Encounter for screening mammogram for malignant neoplasm of breast: Secondary | ICD-10-CM | POA: Diagnosis not present

## 2018-08-31 ENCOUNTER — Encounter: Payer: Self-pay | Admitting: Emergency Medicine

## 2018-08-31 DIAGNOSIS — F4001 Agoraphobia with panic disorder: Secondary | ICD-10-CM

## 2018-08-31 DIAGNOSIS — F341 Dysthymic disorder: Secondary | ICD-10-CM

## 2018-10-04 ENCOUNTER — Encounter: Payer: Self-pay | Admitting: Psychiatry

## 2018-10-04 ENCOUNTER — Ambulatory Visit (INDEPENDENT_AMBULATORY_CARE_PROVIDER_SITE_OTHER): Payer: BLUE CROSS/BLUE SHIELD | Admitting: Psychiatry

## 2018-10-04 DIAGNOSIS — F4001 Agoraphobia with panic disorder: Secondary | ICD-10-CM

## 2018-10-04 DIAGNOSIS — F422 Mixed obsessional thoughts and acts: Secondary | ICD-10-CM

## 2018-10-04 DIAGNOSIS — Z8659 Personal history of other mental and behavioral disorders: Secondary | ICD-10-CM | POA: Diagnosis not present

## 2018-10-04 DIAGNOSIS — F429 Obsessive-compulsive disorder, unspecified: Secondary | ICD-10-CM | POA: Diagnosis not present

## 2018-10-04 MED ORDER — FLUOXETINE HCL 20 MG PO TABS: 60.0000 mg | ORAL_TABLET | Freq: Every evening | ORAL | 3 refills | Status: DC

## 2018-10-04 MED ORDER — FLUOXETINE HCL 20 MG PO TABS
60.0000 mg | ORAL_TABLET | Freq: Every evening | ORAL | 3 refills | Status: DC
Start: 2018-10-04 — End: 2018-10-04

## 2018-10-04 NOTE — Progress Notes (Signed)
Christine [name] [id number] 1955-09-13 63 y.o.  Subjective:   Patient ID:  Christine [name] is a 63 y.o. (DOB Oct 09, 1955) female.  Chief Complaint:  Chief Complaint  Patient presents with  . Follow-up    Medication Management    HPI Christine [name] presents to the office today for follow-up of OCD, panic, dysthymia.  Doing well.  No complaints.  Patient reports stable mood and denies depressed or irritable moods.  Patient denies any recent difficulty with anxiety. No sig panic.  Moments anxiety driving.  Patient denies difficulty with sleep initiation or maintenance. Denies appetite disturbance.  Patient reports that energy and motivation have been good.  Patient denies any difficulty with concentration.  Patient denies any suicidal ideation.  Aware can obsess under stress if not managed.     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  On SSRI for decades.  Question about statins and mental health.   Review of Systems:  Review of Systems  Neurological: Negative for tremors and weakness.  Psychiatric/Behavioral: Negative for agitation, behavioral problems, confusion, decreased concentration, dysphoric mood, hallucinations, self-injury and sleep disturbance. The patient is not nervous/anxious and is not hyperactive.     Medications: I have reviewed the patient's current medications.  Current Outpatient Medications  Medication Sig Dispense Refill  . estradiol (VIVELLE-DOT) 0.0375 MG/24HR Place 1 patch onto the skin 2 (two) times a week.    Marland Kitchen FLUoxetine (PROZAC) 20 MG tablet Take 60 mg by mouth every evening.     . progesterone (PROMETRIUM) 200 MG capsule TAKE 1 TABLET AT BEDTIME FOR 14 DAYS EVERY OTHER MONTH  6  . Vitamin D, Ergocalciferol, (DRISDOL) 1.25 MG (50000 UT) CAPS capsule     . Cholecalciferol (VITAMIN D3) 50000 units CAPS Take 1 capsule by mouth once a week.    . metoprolol succinate (TOPROL-XL) 25 MG 24 hr tablet TAKE 1/2 TABLET ONCE DAILY WITH A MEAL,  please make overdue appt with Dr. Anne Fu before anymore refills. 2nd attempt (Patient not taking: Reported on 10/04/2018) 7 tablet 0  . progesterone (PROMETRIUM) 200 MG capsule Take 200 mg by mouth every 3 (three) months.     No current facility-administered medications for this visit.     Medication Side Effects: None  Allergies:  Allergies  Allergen Reactions  . Iodine Hives    Topical/ hives    Past Medical History:  Diagnosis Date  . Anxiety disorder   . Headache   . Low blood pressure 02/23/2015  . Malaise and fatigue 07/07/2012  . OCD (obsessive compulsive disorder) 05/29/2011  . Palpitations 05/29/2011    Family History  Problem Relation Age of Onset  . Alzheimer's disease Mother   . Pulmonary embolism Father   . Anxiety disorder Sister   . Diabetes Maternal Grandmother   . Parkinson's disease Maternal Grandmother     Social History   Socioeconomic History  . Marital status: Married    Spouse name: Not on file  . Number of children: 3  . Years of education: Masters   . Highest education level: Not on file  Occupational History  . Occupation: N/A  Social Needs  . Financial resource strain: Not on file  . Food insecurity:    Worry: Not on file    Inability: Not on file  . Transportation needs:    Medical: Not on file    Non-medical: Not on file  Tobacco Use  . Smoking status: Never Smoker  . Smokeless tobacco: Never Used  Substance and Sexual Activity  . Alcohol use: Yes    Comment: Occ  . Drug use: No  . Sexual activity: Not on file  Lifestyle  . Physical activity:    Days per week: Not on file    Minutes per session: Not on file  . Stress: Not on file  Relationships  . Social connections:    Talks on phone: Not on file    Gets together: Not on file    Attends religious service: Not on file    Active member of club or organization: Not on file    Attends meetings of clubs or organizations: Not on file    Relationship status: Not on file  .  Intimate partner violence:    Fear of current or ex partner: Not on file    Emotionally abused: Not on file    Physically abused: Not on file    Forced sexual activity: Not on file  Other Topics Concern  . Not on file  Social History Narrative   Rare caffeine use     Past Medical History, Surgical history, Social history, and Family history were reviewed and updated as appropriate.   Please see review of systems for further details on the patient's review from today.   Objective:   Physical Exam:  LMP  (LMP Unknown)   Physical Exam Constitutional:      General: She is not in acute distress.    Appearance: She is well-developed.  Musculoskeletal:        General: No deformity.  Neurological:     Mental Status: She is alert and oriented to person, place, and time.     Motor: No tremor.     Coordination: Coordination normal.     Gait: Gait normal.  Psychiatric:        Attention and Perception: Attention normal. She is attentive.        Mood and Affect: Mood normal. Mood is not anxious or depressed. Affect is not labile, blunt, angry or inappropriate.        Speech: Speech normal.        Behavior: Behavior normal.        Thought Content: Thought content normal. Thought content does not include homicidal or suicidal ideation. Thought content does not include homicidal or suicidal plan.        Cognition and Memory: Cognition normal.        Judgment: Judgment normal.     Comments: Insight is good. Minimal OCD sx.     Lab Review:     Component Value Date/Time   NA 136 11/17/2016 1930   K 3.7 11/17/2016 1930   CL 102 11/17/2016 1930   CO2 28 11/17/2016 1930   GLUCOSE 88 11/17/2016 1930   BUN 22 (H) 11/17/2016 1930   CREATININE 0.78 11/17/2016 1930   CALCIUM 9.3 11/17/2016 1930   PROT 7.1 07/07/2012 1222   ALBUMIN 3.8 07/07/2012 1222   AST 16 07/07/2012 1222   ALT 15 07/07/2012 1222   ALKPHOS 50 07/07/2012 1222   BILITOT 0.9 07/07/2012 1222   GFRNONAA >60 11/17/2016  1930   GFRAA >60 11/17/2016 1930       Component Value Date/Time   WBC 5.5 11/17/2016 1930   RBC 4.65 11/17/2016 1930   HGB 14.5 11/17/2016 1930   HGB 14.5 06/30/2007 1027   HCT 40.9 11/17/2016 1930   HCT 41.5 06/30/2007 1027   PLT 257 11/17/2016 1930   PLT 236 06/30/2007 1027  MCV 88.0 11/17/2016 1930   MCV 87.1 06/30/2007 1027   MCH 31.2 11/17/2016 1930   MCHC 35.5 11/17/2016 1930   RDW 12.8 11/17/2016 1930   RDW 10.3 (L) 06/30/2007 1027   LYMPHSABS 1.1 07/07/2012 1222   LYMPHSABS 1.9 06/30/2007 1027   MONOABS 0.6 07/07/2012 1222   MONOABS 0.4 06/30/2007 1027   EOSABS 0.1 07/07/2012 1222   EOSABS 0.1 06/30/2007 1027   BASOSABS 0.0 07/07/2012 1222   BASOSABS 0.1 06/30/2007 1027    No results found for: POCLITH, LITHIUM   No results found for: PHENYTOIN, PHENOBARB, VALPROATE, CBMZ   .res Assessment: Plan:    Mixed obsessional thoughts and acts  Panic disorder with agoraphobia  Hx of dysthymia   Continue meds dT high risk relapse if stops meds.  No change indicated.  Disc usual course of OCD and gradual improvement.  Residual panic and driving fears manageable.  Relapse prevention discussed.  FU 1 yeear  Meredith Staggersarey Cottle, MD, DFAPA    Please see After Visit Summary for patient specific instructions.  No future appointments.  No orders of the defined types were placed in this encounter.     -------------------------------

## 2018-10-27 ENCOUNTER — Other Ambulatory Visit: Payer: Self-pay | Admitting: Psychiatry

## 2018-10-28 DIAGNOSIS — R590 Localized enlarged lymph nodes: Secondary | ICD-10-CM | POA: Diagnosis not present

## 2018-11-01 ENCOUNTER — Other Ambulatory Visit: Payer: Self-pay | Admitting: Family Medicine

## 2018-11-01 DIAGNOSIS — R590 Localized enlarged lymph nodes: Secondary | ICD-10-CM

## 2018-11-03 ENCOUNTER — Other Ambulatory Visit: Payer: BLUE CROSS/BLUE SHIELD

## 2018-11-03 ENCOUNTER — Other Ambulatory Visit: Payer: Self-pay

## 2018-11-03 ENCOUNTER — Ambulatory Visit
Admission: RE | Admit: 2018-11-03 | Discharge: 2018-11-03 | Disposition: A | Discharge status: Home / Self Care | Payer: BLUE CROSS/BLUE SHIELD | Source: Ambulatory Visit | Attending: Family Medicine | Admitting: Family Medicine

## 2018-11-03 DIAGNOSIS — R590 Localized enlarged lymph nodes: Secondary | ICD-10-CM

## 2018-11-03 DIAGNOSIS — R922 Inconclusive mammogram: Secondary | ICD-10-CM | POA: Diagnosis not present

## 2018-11-03 DIAGNOSIS — N6489 Other specified disorders of breast: Secondary | ICD-10-CM | POA: Diagnosis not present

## 2018-12-17 DIAGNOSIS — B9689 Other specified bacterial agents as the cause of diseases classified elsewhere: Secondary | ICD-10-CM | POA: Diagnosis not present

## 2018-12-17 DIAGNOSIS — J329 Chronic sinusitis, unspecified: Secondary | ICD-10-CM | POA: Diagnosis not present

## 2019-01-03 DIAGNOSIS — E785 Hyperlipidemia, unspecified: Secondary | ICD-10-CM | POA: Diagnosis not present

## 2019-01-03 DIAGNOSIS — E559 Vitamin D deficiency, unspecified: Secondary | ICD-10-CM | POA: Diagnosis not present

## 2019-01-03 DIAGNOSIS — M791 Myalgia, unspecified site: Secondary | ICD-10-CM | POA: Diagnosis not present

## 2019-04-13 DIAGNOSIS — E7849 Other hyperlipidemia: Secondary | ICD-10-CM | POA: Diagnosis not present

## 2019-04-13 DIAGNOSIS — E559 Vitamin D deficiency, unspecified: Secondary | ICD-10-CM | POA: Diagnosis not present

## 2019-04-19 DIAGNOSIS — H43812 Vitreous degeneration, left eye: Secondary | ICD-10-CM | POA: Diagnosis not present

## 2019-04-22 DIAGNOSIS — H43812 Vitreous degeneration, left eye: Secondary | ICD-10-CM | POA: Diagnosis not present

## 2019-05-03 DIAGNOSIS — H43812 Vitreous degeneration, left eye: Secondary | ICD-10-CM | POA: Diagnosis not present

## 2019-06-02 ENCOUNTER — Telehealth: Payer: Self-pay

## 2019-06-02 NOTE — Telephone Encounter (Signed)
Virtual Visit Pre-Appointment Phone Call  "(Name), I am calling you today to discuss your upcoming appointment. We are currently trying to limit exposure to the virus that causes COVID-19 by seeing patients at home rather than in the office."  1. "What is the BEST phone number to call the day of the visit?" - include this in appointment notes  2. "Do you have or have access to (through a family member/friend) a smartphone with video capability that we can use for your visit?" a. If yes - list this number in appt notes as "cell" (if different from BEST phone #) and list the appointment type as a VIDEO visit in appointment notes b. If no - list the appointment type as a PHONE visit in appointment notes  3. Confirm consent - "In the setting of the current Covid19 crisis, you are scheduled for a (phone or video) visit with your provider on (date) at (time).  Just as we do with many in-office visits, in order for you to participate in this visit, we must obtain consent.  If you'd like, I can send this to your mychart (if signed up) or email for you to review.  Otherwise, I can obtain your verbal consent now.  All virtual visits are billed to your insurance company just like a normal visit would be.  By agreeing to a virtual visit, we'd like you to understand that the technology does not allow for your provider to perform an examination, and thus may limit your provider's ability to fully assess your condition. If your provider identifies any concerns that need to be evaluated in person, we will make arrangements to do so.  Finally, though the technology is pretty good, we cannot assure that it will always work on either your or our end, and in the setting of a video visit, we may have to convert it to a phone-only visit.  In either situation, we cannot ensure that we have a secure connection.  Are you willing to proceed?" STAFF: Did the patient verbally acknowledge consent to telehealth visit? Document  YES/NO here: yes  4. Advise patient to be prepared - "Two hours prior to your appointment, go ahead and check your blood pressure, pulse, oxygen saturation, and your weight (if you have the equipment to check those) and write them all down. When your visit starts, your provider will ask you for this information. If you have an Apple Watch or Kardia device, please plan to have heart rate information ready on the day of your appointment. Please have a pen and paper handy nearby the day of the visit as well."  5. Give patient instructions for MyChart download to smartphone OR Doximity/Doxy.me as below if video visit (depending on what platform provider is using)  6. Inform patient they will receive a phone call 15 minutes prior to their appointment time (may be from unknown caller ID) so they should be prepared to answer    Christine [name] has been deemed a candidate for a follow-up tele-health visit to limit community exposure during the Covid-19 pandemic. I spoke with the patient via phone to ensure availability of phone/video source, confirm preferred email & phone number, and discuss instructions and expectations.  I reminded Christine [name] to be prepared with any vital sign and/or heart rhythm information that could potentially be obtained via home monitoring, at the time of her visit. I reminded Christine [name] to expect a phone call prior to  her visit.  Christine [name], CMA 06/02/2019 1:30 PM   INSTRUCTIONS FOR DOWNLOADING THE MYCHART APP TO SMARTPHONE  - The patient must first make sure to have activated MyChart and know their login information - If Apple, go to [employer] and type in MyChart in the search bar and download the app. If Android, ask patient to go to [employer] and type in [place] in the search bar and download the app. The app is free but as with any other app downloads, their phone may require them to verify saved payment information or  Apple/Android password.  - The patient will need to then log into the app with their MyChart username and password, and select [facility] as their healthcare provider to link the account. When it is time for your visit, go to the MyChart app, find appointments, and click Begin Video Visit. Be sure to Select Allow for your device to access the Microphone and Camera for your visit. You will then be connected, and your provider will be with you shortly.  **If they have any issues connecting, or need assistance please contact MyChart service desk [phone])**  **If using a computer, in order to ensure the best quality for their visit they will need to use either of the following Internet Browsers: [employer] or Google Chrome**  IF USING DOXIMITY or DOXY.ME - The patient will receive a link just prior to their visit by text.     FULL LENGTH CONSENT FOR TELE-HEALTH VISIT   I hereby voluntarily request, consent and authorize [facility] and its employed or contracted physicians, physician assistants, nurse practitioners or other licensed health care professionals (the Practitioner), to provide me with telemedicine health care services (the "Services") as deemed necessary by the treating Practitioner. I acknowledge and consent to receive the Services by the Practitioner via telemedicine. I understand that the telemedicine visit will involve communicating with the Practitioner through live audiovisual communication technology and the disclosure of certain medical information by electronic transmission. I acknowledge that I have been given the opportunity to request an in-person assessment or other available alternative prior to the telemedicine visit and am voluntarily participating in the telemedicine visit.  I understand that I have the right to withhold or withdraw my consent to the use of telemedicine in the course of my care at any time, without affecting my right to future care  or treatment, and that the Practitioner or I may terminate the telemedicine visit at any time. I understand that I have the right to inspect all information obtained and/or recorded in the course of the telemedicine visit and may receive copies of available information for a reasonable fee.  I understand that some of the potential risks of receiving the Services via telemedicine include:  Marland Kitchen Delay or interruption in medical evaluation due to technological equipment failure or disruption; . Information transmitted may not be sufficient (e.g. poor resolution of images) to allow for appropriate medical decision making by the Practitioner; and/or  . In rare instances, security protocols could fail, causing a breach of personal health information.  Furthermore, I acknowledge that it is my responsibility to provide information about my medical history, conditions and care that is complete and accurate to the best of my ability. I acknowledge that Practitioner's advice, recommendations, and/or decision may be based on factors not within their control, such as incomplete or inaccurate data provided by me or distortions of diagnostic images or specimens that may result from electronic transmissions.  I understand that the practice of medicine is not an exact science and that Practitioner makes no warranties or guarantees regarding treatment outcomes. I acknowledge that I will receive a copy of this consent concurrently upon execution via email to the email address I last provided but may also request a printed copy by calling the office of [facility].    I understand that my insurance will be billed for this visit.   I have read or had this consent read to me. . I understand the contents of this consent, which adequately explains the benefits and risks of the Services being provided via telemedicine.  . I have been provided ample opportunity to ask questions regarding this consent and the Services and have had  my questions answered to my satisfaction. . I give my informed consent for the services to be provided through the use of telemedicine in my medical care  By participating in this telemedicine visit I agree to the above.

## 2019-06-02 NOTE — Telephone Encounter (Signed)
Called pt to set up possible evisit for JM on 06/06/2019, left message asking pt to call the office.

## 2019-06-04 NOTE — Progress Notes (Signed)
Virtual Visit via Telephone Note   This visit type was conducted due to national recommendations for restrictions regarding the COVID-19 Pandemic (e.g. social distancing) in an effort to limit this patient's exposure and mitigate transmission in our community.  Due to her co-morbid illnesses, this patient is at least at moderate risk for complications without adequate follow up.  This format is felt to be most appropriate for this patient at this time.  The patient did not have access to video technology/had technical difficulties with video requiring transitioning to audio format only (telephone).  All issues noted in this document were discussed and addressed.  No physical exam could be performed with this format.  Please refer to the patient's chart for her  consent to telehealth for [facility].   Date:  06/04/2019   ID:  Christine [name], DOB 1956/07/01, MRN [id number]  Patient Location: Home Provider Location: Home  PCP:  Cari Caraway, MD  Cardiologist:  Candee Furbish, MD  Electrophysiologist:  None   Evaluation Performed:  Follow-Up Visit  Chief Complaint:  1 year follow up, seen for Dr. Marlou Porch  History of Present Illness:    Christine [name] is a 63 y.o. female with a hx of palpitations, anxiety disorder and HLD.   Ms. Castner had an echocardiogram in 2008 which was normal. Per chart review, last LDL was greater than 190 in which it was recommended that she start atorvastatin. She was last seen by Dr. Marlou Porch 05/2017.   Today she states that  She tried the statin for about 1.5 years and had results with her lab work (followed by PCP), however very poorly during that time with complaints of myalgias.  When this, her PCP more recently stopped her statin and plans were made to attempt a lipid control with diet and exercise only.  Patient reports that lab work from 12/2018 showed an LDL of 147.  She has repeat lab work due 09/01/2019.  We discussed the use of injectable medications for  patient such as her self who are intolerant to statin therapy.  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  As far as her palpitations, her PCP stopped her metoprolol approximately 6 months ago and she has not had any episodes of palpitations since that time.  She still has metoprolol on hand and we discussed adding metoprolol 12.5 mg as needed to her regimen if she needs this.  She agrees.  She has no palpitations, shortness of breath, chest pain, PND, diaphoresis, dizziness or syncope.  Overall she is doing well from a cardiac perspective.  Her anxiety is in more control as of recently.  Very appreciative for lipid clinic referral.  The patient does not have symptoms concerning for COVID-19 infection (fever, chills, cough, or new shortness of breath).   Past Medical History:  Diagnosis Date  . Anxiety disorder   . Headache   . Low blood pressure 02/23/2015  . Malaise and fatigue 07/07/2012  . OCD (obsessive compulsive disorder) 05/29/2011  . Palpitations 05/29/2011   Past Surgical History:  Procedure Laterality Date  . CESAREAN SECTION    . NO PAST SURGERIES       No outpatient medications have been marked as taking for the 06/06/19 encounter (Appointment) with Tommie Raymond, NP.     Allergies:   Iodine   Social History   Tobacco Use  . Smoking status: Never Smoker  . Smokeless tobacco: Never Used  Substance Use  Topics  . Alcohol use: Yes    Comment: Occ  . Drug use: No     Family Hx: The patient's family history includes Alzheimer's disease in her mother; Anxiety disorder in her sister; Diabetes in her maternal grandmother; Parkinson's disease in her maternal grandmother; Pulmonary embolism in her father.  ROS:   Please see the history of present illness.     All other systems reviewed and are negative.  Prior CV studies:   The following studies were reviewed today:  None   Labs/Other Tests and Data Reviewed:    EKG:   No ECG reviewed.  Recent Labs: No results found for requested labs within last 8760 hours.   Recent Lipid Panel Lab Results  Component Value Date/Time   CHOL 219 (H) 07/07/2012 12:22 PM   TRIG 85.0 07/07/2012 12:22 PM   HDL 50.40 07/07/2012 12:22 PM   CHOLHDL 4 07/07/2012 12:22 PM   LDLDIRECT 151.8 07/07/2012 12:22 PM    Wt Readings from Last 3 Encounters:  06/18/17 131 lb (59.4 kg)  11/20/16 134 lb (60.8 kg)  11/17/16 130 lb (59 kg)     Objective:    Vital Signs:  LMP  (LMP Unknown)    VITAL SIGNS:  reviewed GEN:  no acute distress RESPIRATORY:  normal respiratory effort NEURO:  alert and oriented x 3, no obvious focal deficit PSYCH:  normal affect  ASSESSMENT & PLAN:    1. Palpitations: -Low-dose metoprolol stopped by PCP approximately 6 months ago with no recurrent symptoms -We will add as needed metoprolol dosing to her medication plan if she needs it. -No changes  2. HLD: -Last LDL by PCP was greater than 190>>thought likely to be familial  -Darted on atorvastatin however this is since been discontinued by her PCP.  Plan was for diet and exercise given patient intolerance to statin therapy with myalgias -Most recent LDL 12/2018 of 147 per patient report>> followed closely by PCP -We will make lipid clinic referral for possible PCSK9 inhibitor as she will likely be a good candidate.  Patient is interested in pursuing   COVID-19 Education: The signs and symptoms of COVID-19 were discussed with the patient and how to seek care for testing (follow up with PCP or arrange E-visit).  The importance of social distancing was discussed today.  Time:   Today, I have spent 20 minutes with the patient with telehealth technology discussing the above problems.     Medication Adjustments/Labs and Tests Ordered: Current medicines are reviewed at length with the patient today.  Concerns regarding medicines are outlined above.   Tests Ordered: No orders of the defined types  were placed in this encounter.   Medication Changes: No orders of the defined types were placed in this encounter.   Follow Up:  Either In Person or Virtual Visit in 1 year(s)    Signed, Georgie Chard, NP  06/04/2019 4:54 PM    [facility]

## 2019-06-06 ENCOUNTER — Telehealth (INDEPENDENT_AMBULATORY_CARE_PROVIDER_SITE_OTHER): Payer: BC Managed Care – PPO | Admitting: Cardiology

## 2019-06-06 ENCOUNTER — Other Ambulatory Visit: Payer: Self-pay

## 2019-06-06 ENCOUNTER — Encounter: Payer: Self-pay | Admitting: Cardiology

## 2019-06-06 VITALS — Ht 63.5 in | Wt 130.0 lb

## 2019-06-06 DIAGNOSIS — R002 Palpitations: Secondary | ICD-10-CM | POA: Diagnosis not present

## 2019-06-06 DIAGNOSIS — E7800 Pure hypercholesterolemia, unspecified: Secondary | ICD-10-CM | POA: Diagnosis not present

## 2019-06-06 DIAGNOSIS — E785 Hyperlipidemia, unspecified: Secondary | ICD-10-CM | POA: Diagnosis not present

## 2019-06-06 NOTE — Patient Instructions (Signed)
Medication Instructions:  Your physician recommends that you continue on your current medications as directed. Please refer to the Current Medication list given to you today.  If you need a refill on your cardiac medications before your next appointment, please call your pharmacy.   Lab work: NONE ORDERED If you have labs (blood work) drawn today and your tests are completely normal, you will receive your results only by: Marland Kitchen MyChart Message (if you have MyChart) OR . A paper copy in the mail If you have any lab test that is abnormal or we need to change your treatment, we will call you to review the results.  Testing/Procedures: NONE ORDERED   Follow-Up: At [facility], you and your health needs are our priority.  As part of our continuing mission to provide you with exceptional heart care, we have created designated Provider Care Teams.  These Care Teams include your primary Cardiologist (physician) and Advanced Practice Providers (APPs -  Physician Assistants and Nurse Practitioners) who all work together to provide you with the care you need, when you need it. You will need a follow up appointment in 12 months.  Please call our office 2 months in advance to schedule this appointment.  You may see Candee Furbish, MD or one of the following Advanced Practice Providers on your designated Care Team:   Truitt Merle, NP Cecilie Kicks, NP . Kathyrn Drown, NP  YOU HAVE BEEN REFERRED TO [facility]. THEY WILL CALL YOU TO SCHEDULE AN APPOINTMENT.

## 2019-06-08 ENCOUNTER — Telehealth: Payer: Self-pay | Admitting: Internal Medicine

## 2019-06-08 NOTE — Telephone Encounter (Signed)
LVM for patient to call and schedule a new lipid clinic appointment with Dr. Debara Pickett.

## 2019-07-12 ENCOUNTER — Other Ambulatory Visit: Payer: Self-pay

## 2019-07-12 DIAGNOSIS — J019 Acute sinusitis, unspecified: Secondary | ICD-10-CM | POA: Diagnosis not present

## 2019-07-12 DIAGNOSIS — Z20822 Contact with and (suspected) exposure to covid-19: Secondary | ICD-10-CM

## 2019-07-13 LAB — NOVEL CORONAVIRUS, NAA: SARS-CoV-2, NAA: NOT DETECTED

## 2019-08-12 DIAGNOSIS — B9689 Other specified bacterial agents as the cause of diseases classified elsewhere: Secondary | ICD-10-CM | POA: Diagnosis not present

## 2019-08-12 DIAGNOSIS — J301 Allergic rhinitis due to pollen: Secondary | ICD-10-CM | POA: Diagnosis not present

## 2019-08-12 DIAGNOSIS — J329 Chronic sinusitis, unspecified: Secondary | ICD-10-CM | POA: Diagnosis not present

## 2019-08-29 DIAGNOSIS — E785 Hyperlipidemia, unspecified: Secondary | ICD-10-CM | POA: Diagnosis not present

## 2019-08-29 DIAGNOSIS — E559 Vitamin D deficiency, unspecified: Secondary | ICD-10-CM | POA: Diagnosis not present

## 2019-08-31 ENCOUNTER — Other Ambulatory Visit: Payer: Self-pay

## 2019-08-31 ENCOUNTER — Encounter: Payer: Self-pay | Admitting: Internal Medicine

## 2019-08-31 ENCOUNTER — Ambulatory Visit: Payer: BC Managed Care – PPO | Admitting: Internal Medicine

## 2019-08-31 VITALS — BP 103/67 | HR 66 | Ht 63.0 in | Wt 135.6 lb

## 2019-08-31 DIAGNOSIS — Z789 Other specified health status: Secondary | ICD-10-CM | POA: Diagnosis not present

## 2019-08-31 DIAGNOSIS — E785 Hyperlipidemia, unspecified: Secondary | ICD-10-CM | POA: Diagnosis not present

## 2019-08-31 MED ORDER — ROSUVASTATIN CALCIUM 20 MG PO TABS: 20.0000 mg | ORAL_TABLET | Freq: Every day | ORAL | 3 refills | Status: DC

## 2019-08-31 NOTE — Progress Notes (Signed)
LIPID CLINIC CONSULT NOTE  Chief Complaint:  Possible familial hyperlipidemia  Primary Care Physician: Cari Caraway, MD  Primary Cardiologist:  Candee Furbish, MD  HPI:  Christine [name] is a 64 y.o. female who is being seen today for the evaluation of possible familial hyperlipidemia at the request of Tommie Raymond, NP.  This is a pleasant 64 year old female kindly referred for evaluation and management of dyslipidemia.  There is concern for possible familial hyperlipidemia given a strong family history of elevated cholesterol.  She reports both of her siblings have high cholesterol as well as her father who had died of a pulmonary embolus at age 109.  She has no known early onset heart disease.  She has a history of palpitations which she was recently evaluated for.  LDL in the past has been greater than 190 however recent labs show total cholesterol of 254, HDL 66, LDL 175 and triglycerides 81.  Unfortunately, she was not able to tolerate atorvastatin.  She was only on the 10 mg dose and took it for years, but had significant myalgias which she put up with.  She said after discontinuing that the symptoms went away.  She has not tried any other statins.  She reports a generally healthy diet however reports it could be better.  PMHx:  Past Medical History:  Diagnosis Date  . Anxiety disorder   . Headache   . Low blood pressure 02/23/2015  . Malaise and fatigue 07/07/2012  . OCD (obsessive compulsive disorder) 05/29/2011  . Palpitations 05/29/2011    Past Surgical History:  Procedure Laterality Date  . CESAREAN SECTION    . NO PAST SURGERIES      FAMHx:  Family History  Problem Relation Age of Onset  . Alzheimer's disease Mother   . Pulmonary embolism Father   . Anxiety disorder Sister   . Diabetes Maternal Grandmother   . Parkinson's disease Maternal Grandmother     SOCHx:   reports that she has never smoked. She has never used smokeless tobacco. She reports current  alcohol use. She reports that she does not use drugs.  ALLERGIES:  Allergies  Allergen Reactions  . Iodine Hives    Topical/ hives    ROS: Pertinent items noted in HPI and remainder of comprehensive ROS otherwise negative.  HOME MEDS: Current Outpatient Medications on File Prior to Visit  Medication Sig Dispense Refill  . aspirin EC 81 MG tablet Take 81 mg by mouth daily.    Marland Kitchen estradiol (VIVELLE-DOT) 0.0375 MG/24HR Place 1 patch onto the skin 2 (two) times a week.    . fexofenadine (ALLEGRA) 180 MG tablet Take 180 mg by mouth daily.    Marland Kitchen FLUoxetine (PROZAC) 20 MG capsule TAKE 3 CAPSULES DAILY 270 capsule 3  . fluticasone (FLONASE) 50 MCG/ACT nasal spray Place 1 spray into both nostrils daily.    . progesterone (PROMETRIUM) 200 MG capsule TAKE 1 TABLET AT BEDTIME FOR 14 DAYS EVERY OTHER MONTH  6  . Vitamin D, Cholecalciferol, 50 MCG (2000 UT) CAPS Take 1 tablet by mouth daily.     No current facility-administered medications on file prior to visit.    LABS/IMAGING: No results found for this or any previous visit (from the past 48 hour(s)). No results found.  LIPID PANEL:    Component Value Date/Time   CHOL 219 (H) 07/07/2012 1222   TRIG 85.0 07/07/2012 1222   HDL 50.40 07/07/2012 1222   CHOLHDL 4 07/07/2012 1222   VLDL 17.0  07/07/2012 1222   LDLDIRECT 151.8 07/07/2012 1222    WEIGHTS: Wt Readings from Last 3 Encounters:  08/31/19 135 lb 9.6 oz (61.5 kg)  06/06/19 130 lb (59 kg)  06/18/17 131 lb (59.4 kg)    VITALS: BP 103/67   Pulse 66   Ht 5\' 3"  (1.6 m)   Wt 135 lb 9.6 oz (61.5 kg)   LMP  (LMP Unknown)   SpO2 98%   BMI 24.02 kg/m   EXAM: General appearance: alert and no distress Neck: no carotid bruit, no JVD and thyroid not enlarged, symmetric, no tenderness/mass/nodules Lungs: clear to auscultation bilaterally Heart: regular rate and rhythm Abdomen: soft, non-tender; bowel sounds normal; no masses,  no organomegaly Extremities: extremities normal,  atraumatic, no cyanosis or edema and No tendon xanthomas corneal arcus Pulses: 2+ and symmetric Skin: Skin color, texture, turgor normal. No rashes or lesions Neurologic: Grossly normal Psych: Pleasant  EKG: Deferred  ASSESSMENT: 1. 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 Family history of marked dyslipidemia in first-degree relatives 3,   statin intolerance-myalgias  PLAN: 1.   Christine [name] is a pleasant 64 year old female who has a mixed dyslipidemia with high LDL cholesterol.  She had been intolerant to atorvastatin although had a generally good response to that.  She has not tried other statins and I do not think could be listed as statin intolerant at this point.  I recommend high potency rosuvastatin 20 mg as an alternative which should give her at least 50% reduction in LDL cholesterol.  We will plan to repeat this in about 3 months.  I also like to obtain coronary calcium scoring to better risk stratify her.  She is interested in genetic testing which is reasonable given her family history of elevated cholesterol and I will refer to Dr.Joseph for further evaluation.  Thanks again for the kind referral.  79, MD, [facility]    [facility]  Medical Director of the Advanced Lipid Disorders &  Cardiovascular Risk Reduction Clinic Diplomate of the American Board of Clinical Lipidology Attending Cardiologist  Direct Dial: [phone]  Fax: [phone]  Website:  [email or web address] Christine [name] 08/31/2019, 8:54 AM

## 2019-08-31 NOTE — Patient Instructions (Signed)
Medication Instructions:  START ROSUVASTATIN 20 MG ONCE DAILY *If you need a refill on your cardiac medications before your next appointment, please call your pharmacy*  Lab Work: Your physician recommends that you return for lab work in: 3 MONTHS PRIOR TO EATING If you have labs (blood work) drawn today and your tests are completely normal, you will receive your results only by: Marland Kitchen MyChart Message (if you have MyChart) OR . A paper copy in the mail If you have any lab test that is abnormal or we need to change your treatment, we will call you to review the results.  Testing/Procedures: CORONARY CALCIUM SCORE AT [address]  Follow-Up: At [facility], you and your health needs are our priority.  As part of our continuing mission to provide you with exceptional heart care, we have created designated Provider Care Teams.  These Care Teams include your primary Cardiologist (physician) and Advanced Practice Providers (APPs -  Physician Assistants and Nurse Practitioners) who all work together to provide you with the care you need, when you need it.  Your next appointment:   3 month(s)  The format for your next appointment:   Either In Person or Virtual  Provider:   K. [place] Hilty, MD  Other Instructions REFERRAL TO DR Sidney Ace FOR GENETIC TESTING

## 2019-09-01 DIAGNOSIS — Z0001 Encounter for general adult medical examination with abnormal findings: Secondary | ICD-10-CM | POA: Diagnosis not present

## 2019-09-01 DIAGNOSIS — M722 Plantar fascial fibromatosis: Secondary | ICD-10-CM | POA: Diagnosis not present

## 2019-09-01 DIAGNOSIS — J301 Allergic rhinitis due to pollen: Secondary | ICD-10-CM | POA: Diagnosis not present

## 2019-09-01 DIAGNOSIS — Z23 Encounter for immunization: Secondary | ICD-10-CM | POA: Diagnosis not present

## 2019-09-05 ENCOUNTER — Other Ambulatory Visit: Payer: Self-pay

## 2019-09-05 ENCOUNTER — Ambulatory Visit (INDEPENDENT_AMBULATORY_CARE_PROVIDER_SITE_OTHER)
Admission: RE | Admit: 2019-09-05 | Discharge: 2019-09-05 | Disposition: A | Discharge status: Home / Self Care | Payer: Self-pay | Source: Ambulatory Visit | Attending: Internal Medicine | Admitting: Internal Medicine

## 2019-09-05 DIAGNOSIS — E785 Hyperlipidemia, unspecified: Secondary | ICD-10-CM

## 2019-09-07 ENCOUNTER — Other Ambulatory Visit: Payer: Self-pay | Admitting: Internal Medicine

## 2019-09-07 DIAGNOSIS — R918 Other nonspecific abnormal finding of lung field: Secondary | ICD-10-CM

## 2019-09-08 ENCOUNTER — Ambulatory Visit: Payer: BC Managed Care – PPO | Admitting: Genetic Counselor

## 2019-09-08 ENCOUNTER — Other Ambulatory Visit: Payer: Self-pay

## 2019-09-08 DIAGNOSIS — E785 Hyperlipidemia, unspecified: Secondary | ICD-10-CM

## 2019-09-08 DIAGNOSIS — Z789 Other specified health status: Secondary | ICD-10-CM

## 2019-09-19 NOTE — Progress Notes (Addendum)
Pre-test Genetic Consult notes   Referring Provider: Kenneth Mali Hilty, MD   Referral Reason Christine [name] was referred for genetic consult and testing of familial hypercholesterolemia (FH).  Genetic Consultation Notes  We walked through the risk factors that can lead to hypercholesterolemia. I explained to Christine [name] that the characteristic features of a genetic condition include absence of risk factors that can lead to elevated LDL-C, early age of presentation, increased disease severity and family history of the condition. The clinical manifestations of FH were also reviewed. She denies having xanthomas, corneal arcus or a heart attack.  I discussed the molecular pathogenesis of FH. I informed Christine that Christine [name] is primarily caused by pathogenic variants in three genes, namely APOB, LDLR and PCSK9. These pathogenic variants impact LDLR synthesis, degradation and recycling in cells and results in elevated LDL-C levels. We then walked through autosomal dominant inheritance pattern and viewed pedigree of families with heterozygous FH (HeFH) and homozygous FH (HoFH). Explained to Christine that digenic or compound heterozygous mutations in APOB, LDLR and PCSK9 genes can cause HoFH.   We reviewed the likely outcomes of FH genetic testing. Based on the diagnostic criteria for FH, yields can range from 50%-90%. A positive yield is observed in  ~63% of patients with a definite clinical diagnosis of FH. A negative test does not exclude a genetic basis for FH. In some cases, polygenic inheritance, where more than an average number of common variants, each with small effect, are known to increase plasma lipid levels. Additionally, limitations in current genetic testing methodology can produce a negative result. Variants of unknown significance (VUS) can be seen in some cases. I explained that typically a VUS is so classified if the variant is not well understood as very few individuals have been reported to harbor this variant  or its role in gene function has not been elucidated. Screening other first-degree family members by genetic testing was also discussed. Additionally, we briefly touched upon the molecular basis of the different treatment modalities that are currently available.  Christine medical and 5-generation family history was obtained. See details below-  Christine [name] (III.3 on pedigree) is a pleasant 64 year old Caucasian lady who was found to have LD-C of 71 at age 70. She was on statins for about a year and a half that lowered Christine LDL-C to 167. However, due to severe fibromyalgia she reports discontinuing Christine medication and making lifestyle changes to control Christine lips levels.   Risk Factors Christine [name] denies having hypothyroidism, renal or hepatic dysfunction, diabetes or hypertension that can also lead to [place]. She does not smoke and leads healthy lifestyle of Mediterranean diet and exercise.  Family history Christine [name] (III.3) has 2 daughters, ages 41 and 5 (IV.6, IV.53) and a 62 year-old [name] (IV.8). Christine [name] (IV.6) was found to have elevated lipids at 26. She has not had any adverse events. She is not on medication and controls Christine lipids levels with diet and exercise. She has 3 daughters (V.1-V.3), ages 20, 57 and 1. She is not in touch with Christine [name] (IV.7) and hence not aware of Christine current health status. Christine [name] (IV.8) has not yet had lipid panel testing.   Christine [name] has two older siblings; a brother (III.1) age 62 and a sister (III.2) age 92. Both siblings have hypercholesterolemia, diagnosed in their 31s, but without adverse events.  In addition, Christine brother was found to have WPW syndrome in his 11s.  Christine father (II.2) was diagnosed with phlebitis and  died of a pulmonary embolism at age 65. His elder sister (II.1) died of natural causes in Christine 18s. Paternal grandfather (I.1) died of pneumonia at age 30; Christine grandmother (I.2) was hit by a car while walking down the street.    Christine [name]'s mother (II.3) died at age 61. She reports having high cholesterol with advanced age and also had no adverse events. She is not aware of high cholesterol amongst Christine mother's siblings (II.4-II.6). She does report sudden death in Christine maternal grandfather (I.3). He was in his 30s when he collapsed and died while working in the Insurance account manager. She thinks it is highly unlikely that an autopsy was done to confirm the cause of his death.  Impression and Plans  In summary, Christine [name] was diagnosed with hypercholesterolemia at age 10 in the absence of typical risk factors for this condition. There is also a family history of hypercholesterolemia amongst Christine siblings, and [name] and sudden death at a vey young age in Christine maternal grandfather.   In light of Christine age and severity of presentation and significant family history of hypercholesterolemia in Christine first-degree relatives and sudden death in a second-degree relative, genetic testing is recommended. Genetic testing should evaluate the major genes implicated in familial hypercholesterolemia. Since this is an autosomal dominant condition, Christine children are at a 50% risk of inheriting it. Christine [name] verbalized understanding of this.  In addition, we discussed the protections afforded by the Genetic Information Non-Discrimination Act (GINA). I explained to Christine that GINA protects Christine from losing Christine employment or health insurance based on Christine genotype. However, these protections do not cover life insurance and disability. She verbalized understanding of this.  Please note that the patient has not been counseled in this visit on personal, cultural or ethical issues that he may face due to his heart condition.   Plans Christine [name] would like to pursue genetic testing. Blood was drawn today and sent for testing.    Christine [name], Ph.D, [facility] Clinical Molecular Geneticist

## 2019-09-19 NOTE — Addendum Note (Signed)
Addended by: Lindell Spar on: 09/19/2019 02:23 PM   Modules accepted: Orders

## 2019-10-04 ENCOUNTER — Encounter: Payer: Self-pay | Admitting: Psychiatry

## 2019-10-04 ENCOUNTER — Other Ambulatory Visit: Payer: Self-pay

## 2019-10-04 ENCOUNTER — Ambulatory Visit (INDEPENDENT_AMBULATORY_CARE_PROVIDER_SITE_OTHER): Payer: BC Managed Care – PPO | Admitting: Psychiatry

## 2019-10-04 DIAGNOSIS — F422 Mixed obsessional thoughts and acts: Secondary | ICD-10-CM | POA: Diagnosis not present

## 2019-10-04 DIAGNOSIS — F4001 Agoraphobia with panic disorder: Secondary | ICD-10-CM | POA: Diagnosis not present

## 2019-10-04 NOTE — Progress Notes (Addendum)
Christine [name] [id number] May 24, 1956 64 y.o.  Subjective:   Patient ID:  Christine [name] is a 64 y.o. (DOB Jul 02, 1956) female.  Chief Complaint:  Chief Complaint  Patient presents with  . Follow-up    Medication Management  . Other    OCD    HPI [name] presents to the office today for follow-up of OCD, panic, dysthymia.  Last seen 09/2018.Marland Kitchen  No meds changed.  Doing well.  No complaints.  Patient reports stable mood and denies depressed or irritable moods.  Patient denies any recent difficulty with anxiety. No sig panic.  Moments anxiety driving.  Patient denies difficulty with sleep initiation or maintenance. Denies appetite disturbance.  Patient reports that energy and motivation have been good.  CO some brain fog decisions.  Patient denies any suicidal ideation.  Aware can obsess under stress if not managed.    OCD manageable.  End of summer more near panic driving but resolved.  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  On SSRI for decades.  Question about statins and mental health.  Past Psychiatric Medication Trials: Fluoxetine 60 since 2013, Lexapro, duloxetine, sertraline, risperidone depression, Abilify depression   Review of Systems:  Review of Systems  Eyes: Positive for visual disturbance.  Neurological: Negative for tremors and weakness.  Psychiatric/Behavioral: Negative for agitation, behavioral problems, confusion, decreased concentration, dysphoric mood, hallucinations, self-injury and sleep disturbance. The patient is not nervous/anxious and is not hyperactive.     Medications: I have reviewed the patient's current medications.  Current Outpatient Medications  Medication Sig Dispense Refill  . aspirin EC 81 MG tablet Take 81 mg by mouth daily.    Marland Kitchen estradiol (VIVELLE-DOT) 0.0375 MG/24HR Place 1 patch onto the skin 2 (two) times a week.    . fexofenadine (ALLEGRA) 180 MG tablet Take 180 mg by mouth daily.    Marland Kitchen FLUoxetine (PROZAC) 20  MG capsule TAKE 3 CAPSULES DAILY 270 capsule 3  . fluticasone (FLONASE) 50 MCG/ACT nasal spray Place 1 spray into both nostrils daily.    . progesterone (PROMETRIUM) 200 MG capsule TAKE 1 TABLET AT BEDTIME FOR 14 DAYS EVERY OTHER MONTH  6  . rosuvastatin (CRESTOR) 20 MG tablet Take 1 tablet (20 mg total) by mouth daily. 90 tablet 3  . Vitamin D, Cholecalciferol, 50 MCG (2000 UT) CAPS Take 1 tablet by mouth daily.     No current facility-administered medications for this visit.    Medication Side Effects: None, unless some sexual  Allergies:  Allergies  Allergen Reactions  . Iodine Hives    Topical/ hives    Past Medical History:  Diagnosis Date  . Anxiety disorder   . Headache   . Low blood pressure 02/23/2015  . Malaise and fatigue 07/07/2012  . OCD (obsessive compulsive disorder) 05/29/2011  . Palpitations 05/29/2011    Family History  Problem Relation Age of Onset  . Alzheimer's disease Mother   . Pulmonary embolism Father   . Anxiety disorder Sister   . Diabetes Maternal Grandmother   . Parkinson's disease Maternal Grandmother     Social History   Socioeconomic History  . Marital status: Married    Spouse name: Not on file  . Number of children: 3  . Years of education: Masters   . Highest education level: Not on file  Occupational History  . Occupation: N/A  Tobacco Use  . Smoking status: Never Smoker  . Smokeless tobacco: Never Used  Substance and Sexual Activity  .  Alcohol use: Yes    Comment: Occ  . Drug use: No  . Sexual activity: Not on file  Other Topics Concern  . Not on file  Social History Narrative   Rare caffeine use    Social Determinants of Health   Financial Resource Strain:   . Difficulty of Paying Living Expenses: Not on file  Food Insecurity:   . Worried About Programme researcher, broadcasting/film/video in the Last Year: Not on file  . Ran Out of Food in the Last Year: Not on file  Transportation Needs:   . Lack of Transportation (Medical): Not on file   . Lack of Transportation (Non-Medical): Not on file  Physical Activity:   . Days of Exercise per Week: Not on file  . Minutes of Exercise per Session: Not on file  Stress:   . Feeling of Stress : Not on file  Social Connections:   . Frequency of Communication with Friends and Family: Not on file  . Frequency of Social Gatherings with Friends and Family: Not on file  . Attends Religious Services: Not on file  . Active Member of Clubs or Organizations: Not on file  . Attends Banker Meetings: Not on file  . Marital Status: Not on file  Intimate Partner Violence:   . Fear of Current or Ex-Partner: Not on file  . Emotionally Abused: Not on file  . Physically Abused: Not on file  . Sexually Abused: Not on file    Past Medical History, Surgical history, Social history, and Family history were reviewed and updated as appropriate.   Please see review of systems for further details on the patient's review from today.   Objective:   Physical Exam:  LMP  (LMP Unknown)   Physical Exam Constitutional:      General: She is not in acute distress.    Appearance: She is well-developed.  Musculoskeletal:        General: No deformity.  Neurological:     Mental Status: She is alert and oriented to person, place, and time.     Motor: No tremor.     Coordination: Coordination normal.     Gait: Gait normal.  Psychiatric:        Attention and Perception: Attention normal. She is attentive.        Mood and Affect: Mood normal. Mood is not anxious or depressed. Affect is not labile, blunt, angry or inappropriate.        Speech: Speech normal. Speech is not slurred.        Behavior: Behavior normal.        Thought Content: Thought content normal. Thought content is not paranoid. Thought content does not include homicidal or suicidal ideation. Thought content does not include homicidal or suicidal plan.        Cognition and Memory: Cognition normal.        Judgment: Judgment normal.      Comments: Insight is good. Minimal OCD sx.     Lab Review:     Component Value Date/Time   NA 136 11/17/2016 1930   K 3.7 11/17/2016 1930   CL 102 11/17/2016 1930   CO2 28 11/17/2016 1930   GLUCOSE 88 11/17/2016 1930   BUN 22 (H) 11/17/2016 1930   CREATININE 0.78 11/17/2016 1930   CALCIUM 9.3 11/17/2016 1930   PROT 7.1 07/07/2012 1222   ALBUMIN 3.8 07/07/2012 1222   AST 16 07/07/2012 1222   ALT 15 07/07/2012 1222   ALKPHOS  50 07/07/2012 1222   BILITOT 0.9 07/07/2012 1222   GFRNONAA >60 11/17/2016 1930   GFRAA >60 11/17/2016 1930       Component Value Date/Time   WBC 5.5 11/17/2016 1930   RBC 4.65 11/17/2016 1930   HGB 14.5 11/17/2016 1930   HGB 14.5 06/30/2007 1027   HCT 40.9 11/17/2016 1930   HCT 41.5 06/30/2007 1027   PLT 257 11/17/2016 1930   PLT 236 06/30/2007 1027   MCV 88.0 11/17/2016 1930   MCV 87.1 06/30/2007 1027   MCH 31.2 11/17/2016 1930   MCHC 35.5 11/17/2016 1930   RDW 12.8 11/17/2016 1930   RDW 10.3 (L) 06/30/2007 1027   LYMPHSABS 1.1 07/07/2012 1222   LYMPHSABS 1.9 06/30/2007 1027   MONOABS 0.6 07/07/2012 1222   MONOABS 0.4 06/30/2007 1027   EOSABS 0.1 07/07/2012 1222   EOSABS 0.1 06/30/2007 1027   BASOSABS 0.0 07/07/2012 1222   BASOSABS 0.1 06/30/2007 1027    No results found for: POCLITH, LITHIUM   No results found for: PHENYTOIN, PHENOBARB, VALPROATE, CBMZ   .res Assessment: Plan:    Mixed obsessional thoughts and acts  Panic disorder with agoraphobia   Continue meds dT high risk relapse if stops meds.  No change indicated. Disc normal dosing for OCD.  Never took Cerefolin NAC consistently enough to see benefit for brain fog.  Be consistent for 3 mos trial. She agrees.  Send to to [facility].  Disc usual course of OCD and gradual improvement.  Residual panic and driving fears manageable.  Relapse prevention discussed.  Disc vitamin D and Covid protection.  FU 1 yeear  Meredith Staggers, MD, DFAPA    Please see After  Visit Summary for patient specific instructions.  Future Appointments  Date Time Provider Department Center  11/28/2019  8:15 AM Hilty, Lisette Abu, MD [facility]    No orders of the defined types were placed in this encounter.     -------------------------------

## 2019-10-05 DIAGNOSIS — Z01419 Encounter for gynecological examination (general) (routine) without abnormal findings: Secondary | ICD-10-CM | POA: Diagnosis not present

## 2019-10-05 DIAGNOSIS — Z780 Asymptomatic menopausal state: Secondary | ICD-10-CM | POA: Diagnosis not present

## 2019-10-05 DIAGNOSIS — Z6823 Body mass index (BMI) 23.0-23.9, adult: Secondary | ICD-10-CM | POA: Diagnosis not present

## 2019-10-05 DIAGNOSIS — Z124 Encounter for screening for malignant neoplasm of cervix: Secondary | ICD-10-CM | POA: Diagnosis not present

## 2019-10-05 DIAGNOSIS — Z1231 Encounter for screening mammogram for malignant neoplasm of breast: Secondary | ICD-10-CM | POA: Diagnosis not present

## 2019-10-05 DIAGNOSIS — Z1151 Encounter for screening for human papillomavirus (HPV): Secondary | ICD-10-CM | POA: Diagnosis not present

## 2019-10-05 DIAGNOSIS — Z79890 Hormone replacement therapy (postmenopausal): Secondary | ICD-10-CM | POA: Diagnosis not present

## 2019-10-05 DIAGNOSIS — Z130 Encounter for screening for diseases of the blood and blood-forming organs and certain disorders involving the immune mechanism: Secondary | ICD-10-CM | POA: Diagnosis not present

## 2019-10-17 DIAGNOSIS — Z789 Other specified health status: Secondary | ICD-10-CM | POA: Diagnosis not present

## 2019-10-17 DIAGNOSIS — E785 Hyperlipidemia, unspecified: Secondary | ICD-10-CM | POA: Diagnosis not present

## 2019-10-18 DIAGNOSIS — L821 Other seborrheic keratosis: Secondary | ICD-10-CM | POA: Diagnosis not present

## 2019-10-18 DIAGNOSIS — L814 Other melanin hyperpigmentation: Secondary | ICD-10-CM | POA: Diagnosis not present

## 2019-10-18 DIAGNOSIS — D225 Melanocytic nevi of trunk: Secondary | ICD-10-CM | POA: Diagnosis not present

## 2019-10-18 DIAGNOSIS — L858 Other specified epidermal thickening: Secondary | ICD-10-CM | POA: Diagnosis not present

## 2019-11-06 ENCOUNTER — Other Ambulatory Visit: Payer: Self-pay | Admitting: Psychiatry

## 2019-11-28 ENCOUNTER — Ambulatory Visit: Payer: BC Managed Care – PPO | Admitting: Internal Medicine

## 2019-11-30 DIAGNOSIS — E7841 Elevated Lipoprotein(a): Secondary | ICD-10-CM | POA: Diagnosis not present

## 2019-12-28 DIAGNOSIS — J019 Acute sinusitis, unspecified: Secondary | ICD-10-CM | POA: Diagnosis not present

## 2019-12-28 DIAGNOSIS — B9689 Other specified bacterial agents as the cause of diseases classified elsewhere: Secondary | ICD-10-CM | POA: Diagnosis not present

## 2020-02-03 DIAGNOSIS — H4720 Unspecified optic atrophy: Secondary | ICD-10-CM | POA: Diagnosis not present

## 2020-05-23 DIAGNOSIS — J3489 Other specified disorders of nose and nasal sinuses: Secondary | ICD-10-CM | POA: Diagnosis not present

## 2020-06-25 ENCOUNTER — Telehealth: Payer: Self-pay | Admitting: Internal Medicine

## 2020-06-25 NOTE — Telephone Encounter (Signed)
Pt called questioning whether Dr. Rennis Golden wanted to follow up with her. Nurse informed pt that per chart review, MD had noted 3 mo f/u from 1/6 and recommended repeat lab work in 4/21. Pt report she had labs drawn at pcp who decreased her crestor to 10 mg daily.  Appointment scheduled for 11/4 with Dr.Hilty. Pt will have PCP fax lab results for MD to review.

## 2020-06-25 NOTE — Telephone Encounter (Signed)
° ° °  Pt is calling she would ,like to ask Dr. Rennis Golden if she needs a yearly check up for her CT cardiac

## 2020-06-28 ENCOUNTER — Encounter: Payer: Self-pay | Admitting: Internal Medicine

## 2020-06-28 ENCOUNTER — Ambulatory Visit: Payer: BC Managed Care – PPO | Admitting: Internal Medicine

## 2020-06-28 ENCOUNTER — Other Ambulatory Visit: Payer: Self-pay

## 2020-06-28 VITALS — BP 116/74 | HR 66 | Ht 63.5 in | Wt 128.8 lb

## 2020-06-28 DIAGNOSIS — M791 Myalgia, unspecified site: Secondary | ICD-10-CM | POA: Diagnosis not present

## 2020-06-28 DIAGNOSIS — T466X5A Adverse effect of antihyperlipidemic and antiarteriosclerotic drugs, initial encounter: Secondary | ICD-10-CM | POA: Diagnosis not present

## 2020-06-28 DIAGNOSIS — E785 Hyperlipidemia, unspecified: Secondary | ICD-10-CM | POA: Diagnosis not present

## 2020-06-28 NOTE — Patient Instructions (Signed)
Medication Instructions:  No changes *If you need a refill on your cardiac medications before your next appointment, please call your pharmacy*   Lab Work: FASTING Lipid profile in 1 week.  If you have labs (blood work) drawn today and your tests are completely normal, you will receive your results only by: Marland Kitchen MyChart Message (if you have MyChart) OR . A paper copy in the mail If you have any lab test that is abnormal or we need to change your treatment, we will call you to review the results.   Testing/Procedures: None ordered   Follow-Up: At [facility], you and your health needs are our priority.  As part of our continuing mission to provide you with exceptional heart care, we have created designated Provider Care Teams.  These Care Teams include your primary Cardiologist (physician) and Advanced Practice Providers (APPs -  Physician Assistants and Nurse Practitioners) who all work together to provide you with the care you need, when you need it.  We recommend signing up for the patient portal called "MyChart".  Sign up information is provided on this After Visit Summary.  MyChart is used to connect with patients for Virtual Visits (Telemedicine).  Patients are able to view lab/test results, encounter notes, upcoming appointments, etc.  Non-urgent messages can be sent to your provider as well.   To learn more about what you can do with MyChart, go to [email or web address].    Your next appointment:   Follow up with Dr. K. [place] Hilty on an as-needed basis  Other Instructions None

## 2020-06-28 NOTE — Progress Notes (Signed)
LIPID CLINIC CONSULT NOTE  Chief Complaint:  Follow-up dyslipidemia  Primary Care Physician: Gweneth Dimitri, MD  Primary Cardiologist:  Donato Schultz, MD  HPI:  Christine [name] is a 64 y.o. female who is being seen today for the evaluation of possible familial hyperlipidemia at the request of Gweneth Dimitri, MD.  This is a pleasant 64 year old female kindly referred for evaluation and management of dyslipidemia.  There is concern for possible familial hyperlipidemia given a strong family history of elevated cholesterol.  She reports both of her siblings have high cholesterol as well as her father who had died of a pulmonary embolus at age 84.  She has no known early onset heart disease.  She has a history of palpitations which she was recently evaluated for.  LDL in the past has been greater than 190 however recent labs show total cholesterol of 254, HDL 66, LDL 175 and triglycerides 81.  Unfortunately, she was not able to tolerate atorvastatin.  She was only on the 10 mg dose and took it for years, but had significant myalgias which she put up with.  She said after discontinuing that the symptoms went away.  She has not tried any other statins.  She reports a generally healthy diet however reports it could be better.  06/29/2020  Christine [name] returns for follow-up of dyslipidemia.  She has a history of significantly elevated LDL cholesterol, but had not tried other statins besides atorvastatin.  I had recommended high potency rosuvastatin 20 mg daily however she ultimately has gone on to 10 mg daily.  She did have repeat labs however in April which already showed a significant reduction in her lipids with total cholesterol 165, HDL 74, LDL 80 and triglycerides 54.  She has not had reassessment since then.  Overall she seems to be tolerating the medication well.  PMHx:  Past Medical History:  Diagnosis Date  . Anxiety disorder   . Headache   . Low blood pressure 02/23/2015  . Malaise and fatigue  07/07/2012  . OCD (obsessive compulsive disorder) 05/29/2011  . Palpitations 05/29/2011    Past Surgical History:  Procedure Laterality Date  . CESAREAN SECTION    . NO PAST SURGERIES      FAMHx:  Family History  Problem Relation Age of Onset  . Alzheimer's disease Mother   . Pulmonary embolism Father   . Anxiety disorder Sister   . Diabetes Maternal Grandmother   . Parkinson's disease Maternal Grandmother     SOCHx:   reports that she has never smoked. She has never used smokeless tobacco. She reports current alcohol use. She reports that she does not use drugs.  ALLERGIES:  Allergies  Allergen Reactions  . Iodine Hives    Topical/ hives    ROS: Pertinent items noted in HPI and remainder of comprehensive ROS otherwise negative.  HOME MEDS: Current Outpatient Medications on File Prior to Visit  Medication Sig Dispense Refill  . aspirin EC 81 MG tablet Take 81 mg by mouth daily.    Marland Kitchen FLUoxetine (PROZAC) 20 MG capsule TAKE 3 CAPSULES BY MOUTH EVERY DAY 270 capsule 3  . fluticasone (FLONASE) 50 MCG/ACT nasal spray Place 1 spray into both nostrils daily.    Marland Kitchen loratadine (CLARITIN) 10 MG tablet Take 10 mg by mouth daily as needed for allergies.    . rosuvastatin (CRESTOR) 10 MG tablet Take 10 mg by mouth daily.    . Vitamin D, Cholecalciferol, 50 MCG (2000 UT) CAPS Take 1  tablet by mouth daily.    Marland Kitchen azelastine (ASTELIN) 0.1 % nasal spray azelastine 137 mcg (0.1 %) nasal spray aerosol  USE 1 SPRAY IN EACH NOSTRIL TWICE A DAY (Patient not taking: Reported on 06/28/2020)    . estradiol (VIVELLE-DOT) 0.0375 MG/24HR Place 1 patch onto the skin 2 (two) times a week. (Patient not taking: Reported on 06/28/2020)     No current facility-administered medications on file prior to visit.    LABS/IMAGING: No results found for this or any previous visit (from the past 48 hour(s)). No results found.  LIPID PANEL:    Component Value Date/Time   CHOL 219 (H) 07/07/2012 1222   TRIG  85.0 07/07/2012 1222   HDL 50.40 07/07/2012 1222   CHOLHDL 4 07/07/2012 1222   VLDL 17.0 07/07/2012 1222   LDLDIRECT 151.8 07/07/2012 1222    WEIGHTS: Wt Readings from Last 3 Encounters:  06/28/20 128 lb 12.8 oz (58.4 kg)  08/31/19 135 lb 9.6 oz (61.5 kg)  06/06/19 130 lb (59 kg)    VITALS: BP 116/74   Pulse 66   Ht 5' 3.5" (1.613 m)   Wt 128 lb 12.8 oz (58.4 kg)   LMP  (LMP Unknown)   SpO2 98%   BMI 22.46 kg/m   EXAM: Deferred  EKG: Deferred  ASSESSMENT: 1. 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 Family history of marked dyslipidemia in first-degree relatives 3,   statin intolerance-myalgias on atorvastatin.  PLAN: 1.   Christine [name] seems to have had a significant reduction in her lipids with only a low to moderate dose of rosuvastatin.  Her lab work was in April and is due for reassessment.  We will check a fasting lipid profile and tolerate her rosuvastatin as tolerated but so far she denies any myalgias with this medication.  As her lipids appear to be better controlled and she is tolerating standard statin therapy, I think she could follow-up with her PCP and primary cardiologist for management.  Thanks again for allowing me to participate in her care.  Chrystie Nose, MD, [facility], FACP  [place]  [facility]  Medical Director of the Advanced Lipid Disorders &  Cardiovascular Risk Reduction Clinic Diplomate of the American Board of Clinical Lipidology Attending Cardiologist  Direct Dial: [phone]  Fax: [phone]  Website:  [email or web address] 06/28/2020, 4:39 PM

## 2020-06-29 ENCOUNTER — Encounter: Payer: Self-pay | Admitting: Internal Medicine

## 2020-06-29 DIAGNOSIS — M79671 Pain in right foot: Secondary | ICD-10-CM | POA: Diagnosis not present

## 2020-06-29 DIAGNOSIS — M19071 Primary osteoarthritis, right ankle and foot: Secondary | ICD-10-CM | POA: Diagnosis not present

## 2020-06-29 DIAGNOSIS — M79672 Pain in left foot: Secondary | ICD-10-CM | POA: Diagnosis not present

## 2020-06-29 DIAGNOSIS — M19072 Primary osteoarthritis, left ankle and foot: Secondary | ICD-10-CM | POA: Diagnosis not present

## 2020-07-05 DIAGNOSIS — E785 Hyperlipidemia, unspecified: Secondary | ICD-10-CM | POA: Diagnosis not present

## 2020-07-05 LAB — LIPID PANEL
Chol/HDL Ratio: 2.3 ratio (ref 0.0–4.4)
Cholesterol, Total: 188 mg/dL (ref 100–199)
HDL: 82 mg/dL (ref >39)
LDL Chol Calc (NIH): 94 mg/dL (ref 0–99)
Triglycerides: 64 mg/dL (ref 0–149)
VLDL Cholesterol Cal: 12 mg/dL (ref 5–40)

## 2020-07-12 DIAGNOSIS — M6281 Muscle weakness (generalized): Secondary | ICD-10-CM | POA: Diagnosis not present

## 2020-07-12 DIAGNOSIS — M25672 Stiffness of left ankle, not elsewhere classified: Secondary | ICD-10-CM | POA: Diagnosis not present

## 2020-07-12 DIAGNOSIS — M25671 Stiffness of right ankle, not elsewhere classified: Secondary | ICD-10-CM | POA: Diagnosis not present

## 2020-08-13 DIAGNOSIS — J0100 Acute maxillary sinusitis, unspecified: Secondary | ICD-10-CM | POA: Diagnosis not present

## 2020-09-03 DIAGNOSIS — E559 Vitamin D deficiency, unspecified: Secondary | ICD-10-CM | POA: Diagnosis not present

## 2020-09-03 DIAGNOSIS — Z0000 Encounter for general adult medical examination without abnormal findings: Secondary | ICD-10-CM | POA: Diagnosis not present

## 2020-09-06 ENCOUNTER — Ambulatory Visit (HOSPITAL_COMMUNITY)
Admission: RE | Admit: 2020-09-06 | Discharge: 2020-09-06 | Disposition: A | Discharge status: Home / Self Care | Payer: BC Managed Care – PPO | Source: Ambulatory Visit | Attending: Internal Medicine | Admitting: Internal Medicine

## 2020-09-06 ENCOUNTER — Other Ambulatory Visit: Payer: Self-pay

## 2020-09-06 DIAGNOSIS — R918 Other nonspecific abnormal finding of lung field: Secondary | ICD-10-CM | POA: Insufficient documentation

## 2020-09-06 DIAGNOSIS — R911 Solitary pulmonary nodule: Secondary | ICD-10-CM | POA: Diagnosis not present

## 2020-09-26 DIAGNOSIS — L729 Follicular cyst of the skin and subcutaneous tissue, unspecified: Secondary | ICD-10-CM | POA: Diagnosis not present

## 2020-10-02 ENCOUNTER — Encounter: Payer: Self-pay | Admitting: Psychiatry

## 2020-10-02 ENCOUNTER — Ambulatory Visit (INDEPENDENT_AMBULATORY_CARE_PROVIDER_SITE_OTHER): Payer: BC Managed Care – PPO | Admitting: Psychiatry

## 2020-10-02 ENCOUNTER — Other Ambulatory Visit: Payer: Self-pay

## 2020-10-02 DIAGNOSIS — F422 Mixed obsessional thoughts and acts: Secondary | ICD-10-CM | POA: Diagnosis not present

## 2020-10-02 DIAGNOSIS — F4001 Agoraphobia with panic disorder: Secondary | ICD-10-CM | POA: Diagnosis not present

## 2020-10-02 DIAGNOSIS — Z8659 Personal history of other mental and behavioral disorders: Secondary | ICD-10-CM

## 2020-10-02 MED ORDER — FLUOXETINE HCL 20 MG PO CAPS: 60.0000 mg | ORAL_CAPSULE | Freq: Every day | ORAL | 3 refills | Status: DC

## 2020-10-02 MED ORDER — CEREFOLIN NAC 6-2-600 MG PO TABS: 1.0000 | ORAL_TABLET | Freq: Every day | ORAL | 3 refills | Status: AC

## 2020-10-02 NOTE — Patient Instructions (Signed)
[employer]

## 2020-10-02 NOTE — Progress Notes (Signed)
Christine [name] [id number] 06/01/1956 65 y.o.  Subjective:   Patient ID:  Christine [name] is a 64 y.o. (DOB [birth year]) female.  Chief Complaint:  Chief Complaint  Patient presents with  . Follow-up  . Mixed obsessional thoughts and acts    HPI Christine [name] presents to the office today for follow-up of OCD, panic, dysthymia.  Last seen 09/2019.Marland Kitchen  No meds changed.  10/03/2019 appt noted: No Covid.  Good year with mental health.  Rarely overly anxious with obsessions.  No bothersome SE except less sexual sensation.  Pleased with fluoxetine 60.  Doesn't want to change.  Doing well.  No complaints.  Patient reports stable mood and denies depressed or irritable moods.  Patient denies any recent difficulty with anxiety. No sig panic.  Moments anxiety driving.  Patient denies difficulty with sleep initiation or maintenance. Denies appetite disturbance.  Patient reports that energy and motivation have been good.  CO some brain fog decisions.  Patient denies any suicidal ideation.  Aware can obsess under stress if not managed.    OCD manageable.  End of summer more near panic driving but resolved.  Still some concern with word finding.  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  On SSRI for decades.  Question about statins and mental health.  Past Psychiatric Medication Trials: Fluoxetine 60 since 2013, Lexapro, duloxetine, sertraline, risperidone depression, Abilify depression  All 3 kids with anxiety.  Oldest D tried several meds and started fluoxetine and worked well.  Review of Systems:  Review of Systems  Eyes: Positive for visual disturbance.  Cardiovascular: Negative for palpitations.  Neurological: Negative for tremors and weakness.  Psychiatric/Behavioral: Negative for agitation, behavioral problems, confusion, decreased concentration, dysphoric mood, hallucinations, self-injury and sleep disturbance. The patient is not nervous/anxious and is not hyperactive.      Medications: I have reviewed the patient's current medications.  Current Outpatient Medications  Medication Sig Dispense Refill  . aspirin EC 81 MG tablet Take 81 mg by mouth daily.    . fluticasone (FLONASE) 50 MCG/ACT nasal spray Place 1 spray into both nostrils daily.    Marland Kitchen loratadine (CLARITIN) 10 MG tablet Take 10 mg by mouth daily as needed for allergies.    . rosuvastatin (CRESTOR) 10 MG tablet Take 10 mg by mouth daily.    . Vitamin D, Cholecalciferol, 50 MCG (2000 UT) CAPS Take 1 tablet by mouth daily.    Marland Kitchen azelastine (ASTELIN) 0.1 % nasal spray azelastine 137 mcg (0.1 %) nasal spray aerosol  USE 1 SPRAY IN EACH NOSTRIL TWICE A DAY (Patient not taking: No sig reported)    . estradiol (VIVELLE-DOT) 0.0375 MG/24HR Place 1 patch onto the skin 2 (two) times a week. (Patient not taking: No sig reported)    . FLUoxetine (PROZAC) 20 MG capsule Take 3 capsules (60 mg total) by mouth daily. 270 capsule 3  . Methylfol-Methylcob-Acetylcyst (CEREFOLIN NAC) 6-2-600 MG TABS Take 1 tablet by mouth daily. 90 tablet 3   No current facility-administered medications for this visit.    Medication Side Effects: None, unless some sexual  Allergies:  Allergies  Allergen Reactions  . Iodine Hives    Topical/ hives    Past Medical History:  Diagnosis Date  . Anxiety disorder   . Headache   . Low blood pressure 02/23/2015  . Malaise and fatigue 07/07/2012  . OCD (obsessive compulsive disorder) 05/29/2011  . Palpitations 05/29/2011    Family History  Problem Relation Age of Onset  .  Alzheimer's disease Mother   . Pulmonary embolism Father   . Anxiety disorder Sister   . Diabetes Maternal Grandmother   . Parkinson's disease Maternal Grandmother     Social History   Socioeconomic History  . Marital status: Married    Spouse name: Not on file  . Number of children: 3  . Years of education: Masters   . Highest education level: Not on file  Occupational History  . Occupation: N/A   Tobacco Use  . Smoking status: Never Smoker  . Smokeless tobacco: Never Used  Substance and Sexual Activity  . Alcohol use: Yes    Comment: Occ  . Drug use: No  . Sexual activity: Not on file  Other Topics Concern  . Not on file  Social History Narrative   Rare caffeine use    Social Determinants of Health   Financial Resource Strain: Not on file  Food Insecurity: Not on file  Transportation Needs: Not on file  Physical Activity: Not on file  Stress: Not on file  Social Connections: Not on file  Intimate Partner Violence: Not on file    Past Medical History, Surgical history, Social history, and Family history were reviewed and updated as appropriate.   Please see review of systems for further details on the patient's review from today.   Objective:   Physical Exam:  LMP  (LMP Unknown)   Physical Exam Constitutional:      General: She is not in acute distress.    Appearance: She is well-developed.  Musculoskeletal:        General: No deformity.  Neurological:     Mental Status: She is alert and oriented to person, place, and time.     Motor: No tremor.     Coordination: Coordination normal.     Gait: Gait normal.  Psychiatric:        Attention and Perception: Attention normal. She is attentive.        Mood and Affect: Mood normal. Mood is not anxious or depressed. Affect is not labile, blunt, angry or inappropriate.        Speech: Speech normal. Speech is not slurred.        Behavior: Behavior normal.        Thought Content: Thought content normal. Thought content is not paranoid. Thought content does not include homicidal or suicidal ideation. Thought content does not include homicidal or suicidal plan.        Cognition and Memory: Cognition normal.        Judgment: Judgment normal.     Comments: Insight is good. Minimal OCD sx further improvement     Lab Review:     Component Value Date/Time   NA 136 11/17/2016 1930   K 3.7 11/17/2016 1930   CL 102  11/17/2016 1930   CO2 28 11/17/2016 1930   GLUCOSE 88 11/17/2016 1930   BUN 22 (H) 11/17/2016 1930   CREATININE 0.78 11/17/2016 1930   CALCIUM 9.3 11/17/2016 1930   PROT 7.1 07/07/2012 1222   ALBUMIN 3.8 07/07/2012 1222   AST 16 07/07/2012 1222   ALT 15 07/07/2012 1222   ALKPHOS 50 07/07/2012 1222   BILITOT 0.9 07/07/2012 1222   GFRNONAA >60 11/17/2016 1930   GFRAA >60 11/17/2016 1930       Component Value Date/Time   WBC 5.5 11/17/2016 1930   RBC 4.65 11/17/2016 1930   HGB 14.5 11/17/2016 1930   HGB 14.5 06/30/2007 1027   HCT 40.9 11/17/2016 1930  HCT 41.5 06/30/2007 1027   PLT 257 11/17/2016 1930   PLT 236 06/30/2007 1027   MCV 88.0 11/17/2016 1930   MCV 87.1 06/30/2007 1027   MCH 31.2 11/17/2016 1930   MCHC 35.5 11/17/2016 1930   RDW 12.8 11/17/2016 1930   RDW 10.3 (L) 06/30/2007 1027   LYMPHSABS 1.1 07/07/2012 1222   LYMPHSABS 1.9 06/30/2007 1027   MONOABS 0.6 07/07/2012 1222   MONOABS 0.4 06/30/2007 1027   EOSABS 0.1 07/07/2012 1222   EOSABS 0.1 06/30/2007 1027   BASOSABS 0.0 07/07/2012 1222   BASOSABS 0.1 06/30/2007 1027    No results found for: POCLITH, LITHIUM   No results found for: PHENYTOIN, PHENOBARB, VALPROATE, CBMZ   .res Assessment: Plan:    Mixed obsessional thoughts and acts - Plan: FLUoxetine (PROZAC) 20 MG capsule  Panic disorder with agoraphobia - Plan: FLUoxetine (PROZAC) 20 MG capsule  Hx of dysthymia   Very thankful for the last 7 years bc never remembers having this degree of peace.  Continue meds DT high risk relapse if stops meds.  No change indicated. Disc normal dosing for OCD. She doesn't want to take chance of lower dose to reduce sexual SE.  Never took regularly until last 2 mos Cerefolin NAC consistently enough to see benefit for brain fog. She wants to try it longer.   Be consistent for 3 mos trial. She agrees.  Send to to [facility].  Disc usual course of OCD and gradual improvement.  Residual panic and driving fears  manageable.  Relapse prevention discussed.  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  Disc vitamin D and Covid protection.  FU 1 yeear  Meredith Staggers, MD, DFAPA    Please see After Visit Summary for patient specific instructions.  No future appointments.  No orders of the defined types were placed in this encounter.     -------------------------------

## 2020-10-29 DIAGNOSIS — D225 Melanocytic nevi of trunk: Secondary | ICD-10-CM | POA: Diagnosis not present

## 2020-10-29 DIAGNOSIS — L738 Other specified follicular disorders: Secondary | ICD-10-CM | POA: Diagnosis not present

## 2020-10-29 DIAGNOSIS — D2271 Melanocytic nevi of right lower limb, including hip: Secondary | ICD-10-CM | POA: Diagnosis not present

## 2020-10-29 DIAGNOSIS — L814 Other melanin hyperpigmentation: Secondary | ICD-10-CM | POA: Diagnosis not present

## 2020-11-28 DIAGNOSIS — N951 Menopausal and female climacteric states: Secondary | ICD-10-CM | POA: Diagnosis not present

## 2020-11-28 DIAGNOSIS — N9412 Deep dyspareunia: Secondary | ICD-10-CM | POA: Diagnosis not present

## 2020-11-28 DIAGNOSIS — Z130 Encounter for screening for diseases of the blood and blood-forming organs and certain disorders involving the immune mechanism: Secondary | ICD-10-CM | POA: Diagnosis not present

## 2020-11-28 DIAGNOSIS — Z01419 Encounter for gynecological examination (general) (routine) without abnormal findings: Secondary | ICD-10-CM | POA: Diagnosis not present

## 2020-11-28 DIAGNOSIS — Z1231 Encounter for screening mammogram for malignant neoplasm of breast: Secondary | ICD-10-CM | POA: Diagnosis not present

## 2020-11-28 DIAGNOSIS — Z780 Asymptomatic menopausal state: Secondary | ICD-10-CM | POA: Diagnosis not present

## 2020-12-06 ENCOUNTER — Other Ambulatory Visit: Payer: Self-pay | Admitting: Gynecology

## 2020-12-06 DIAGNOSIS — R928 Other abnormal and inconclusive findings on diagnostic imaging of breast: Secondary | ICD-10-CM

## 2020-12-12 DIAGNOSIS — R928 Other abnormal and inconclusive findings on diagnostic imaging of breast: Secondary | ICD-10-CM | POA: Diagnosis not present

## 2020-12-12 DIAGNOSIS — R922 Inconclusive mammogram: Secondary | ICD-10-CM | POA: Diagnosis not present

## 2020-12-12 DIAGNOSIS — N6489 Other specified disorders of breast: Secondary | ICD-10-CM | POA: Diagnosis not present

## 2020-12-27 ENCOUNTER — Other Ambulatory Visit: Payer: BC Managed Care – PPO

## 2021-03-07 DIAGNOSIS — H43811 Vitreous degeneration, right eye: Secondary | ICD-10-CM | POA: Diagnosis not present

## 2021-05-31 DIAGNOSIS — Z23 Encounter for immunization: Secondary | ICD-10-CM | POA: Diagnosis not present

## 2021-05-31 DIAGNOSIS — M1810 Unilateral primary osteoarthritis of first carpometacarpal joint, unspecified hand: Secondary | ICD-10-CM | POA: Diagnosis not present

## 2021-06-17 DIAGNOSIS — R922 Inconclusive mammogram: Secondary | ICD-10-CM | POA: Diagnosis not present

## 2021-06-17 DIAGNOSIS — R928 Other abnormal and inconclusive findings on diagnostic imaging of breast: Secondary | ICD-10-CM | POA: Diagnosis not present

## 2021-06-19 DIAGNOSIS — M79642 Pain in left hand: Secondary | ICD-10-CM | POA: Diagnosis not present

## 2021-06-19 DIAGNOSIS — M1812 Unilateral primary osteoarthritis of first carpometacarpal joint, left hand: Secondary | ICD-10-CM | POA: Diagnosis not present

## 2021-06-19 DIAGNOSIS — M65311 Trigger thumb, right thumb: Secondary | ICD-10-CM | POA: Diagnosis not present

## 2021-06-19 DIAGNOSIS — M25522 Pain in left elbow: Secondary | ICD-10-CM | POA: Diagnosis not present

## 2021-09-30 ENCOUNTER — Ambulatory Visit: Payer: BC Managed Care – PPO | Admitting: Psychiatry

## 2021-10-30 DIAGNOSIS — M25462 Effusion, left knee: Secondary | ICD-10-CM | POA: Diagnosis not present

## 2021-10-31 DIAGNOSIS — M25562 Pain in left knee: Secondary | ICD-10-CM | POA: Diagnosis not present

## 2021-10-31 DIAGNOSIS — M25462 Effusion, left knee: Secondary | ICD-10-CM | POA: Diagnosis not present

## 2021-11-05 DIAGNOSIS — R2242 Localized swelling, mass and lump, left lower limb: Secondary | ICD-10-CM | POA: Diagnosis not present

## 2021-11-06 ENCOUNTER — Other Ambulatory Visit: Payer: Self-pay | Admitting: Sports Medicine

## 2021-11-06 DIAGNOSIS — R2242 Localized swelling, mass and lump, left lower limb: Secondary | ICD-10-CM

## 2021-11-11 ENCOUNTER — Encounter: Payer: Self-pay | Admitting: Psychiatry

## 2021-11-11 ENCOUNTER — Ambulatory Visit: Payer: BC Managed Care – PPO | Admitting: Psychiatry

## 2021-11-11 ENCOUNTER — Other Ambulatory Visit: Payer: Self-pay

## 2021-11-11 DIAGNOSIS — F4001 Agoraphobia with panic disorder: Secondary | ICD-10-CM

## 2021-11-11 DIAGNOSIS — Z8659 Personal history of other mental and behavioral disorders: Secondary | ICD-10-CM | POA: Diagnosis not present

## 2021-11-11 DIAGNOSIS — F422 Mixed obsessional thoughts and acts: Secondary | ICD-10-CM | POA: Diagnosis not present

## 2021-11-11 MED ORDER — FLUOXETINE HCL 20 MG PO CAPS: 60.0000 mg | ORAL_CAPSULE | Freq: Every day | ORAL | 3 refills | Status: DC

## 2021-11-11 MED ORDER — LORAZEPAM 0.5 MG PO TABS: 0.5000 mg | ORAL_TABLET | Freq: Three times a day (TID) | ORAL | 0 refills | Status: AC | PRN

## 2021-11-11 MED ORDER — L-METHYLFOLATE 15 MG PO TABS: 1.0000 | ORAL_TABLET | Freq: Every day | ORAL | 3 refills | Status: AC

## 2021-11-11 NOTE — Progress Notes (Signed)
Christine [name] ?[id number] ?November 25, 1955 ?66 y.o. ? ?Subjective:  ? ?Patient ID:  Christine [name] is a 66 y.o. (DOB November 25, 1955) female. ? ?Chief Complaint:  ?Chief Complaint  ?Patient presents with  ? Follow-up  ?  Mixed obsessional thoughts and acts  ? ? ?HPI ?Christine [name] presents to the office today for follow-up of OCD, panic, dysthymia. ? ?Last seen 09/2019.Marland Kitchen.  No meds changed. ? ?10/03/2019 appt noted: ?No Covid.  Good year with mental health.  Rarely overly anxious with obsessions.  No bothersome SE except less sexual sensation.  Pleased with fluoxetine 60.  Doesn't want to change. ?  ?09/2020 appt noted: ?Doing well.  No complaints.  Patient reports stable mood and denies depressed or irritable moods.  Patient denies any recent difficulty with anxiety. No sig panic.  Moments anxiety driving.  Patient denies difficulty with sleep initiation or maintenance. Denies appetite disturbance.  Patient reports that energy and motivation have been good.  CO some brain fog decisions.  Patient denies any suicidal ideation.  Aware can obsess under stress if not managed.    OCD manageable.  End of summer more near panic driving but resolved. ?Still some concern with word finding. ?Stay on fluoxetine 60 mg and Cerefolin NAC ? ?11/11/2021 appointment with the following noted: ?Everything is still the same and doing fine except driving anxiety is still a problems.  Could use some counseling.  Misses [facility] May. ?Consistent with fluoxetine 60 mg daily. ?No SE. ?Good health. ?Patient reports stable mood and denies depressed or irritable moods.    Patient denies difficulty with sleep initiation or maintenance more than ususal. Denies appetite disturbance.  Patient reports that energy and motivation have been good.  Patient denies any difficulty with concentration.  Patient denies any suicidal ideation. ?Off NAC.  Was forgetting it. ?Ask abt Xanax for driving. ? 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  On SSRI for  decades.  Question about statins and mental health. ? ?Past Psychiatric Medication Trials: Fluoxetine 60 since 2013, Lexapro, duloxetine, sertraline, risperidone depression, Abilify depression ? ?All 3 kids with anxiety.  Oldest D tried several meds and started fluoxetine and worked well. ? ?Review of Systems:  ?Review of Systems  ?Eyes:  Negative for visual disturbance.  ?Cardiovascular:  Negative for palpitations.  ?Neurological:  Negative for tremors and weakness.  ?Psychiatric/Behavioral:  Negative for agitation, behavioral problems, confusion, decreased concentration, dysphoric mood, hallucinations, self-injury and sleep disturbance. The patient is not nervous/anxious and is not hyperactive.   ? ?Medications: I have reviewed the patient's current medications. ? ?Current Outpatient Medications  ?Medication Sig Dispense Refill  ? aspirin EC 81 MG tablet Take 81 mg by mouth daily.    ? fluticasone (FLONASE) 50 MCG/ACT nasal spray Place 1 spray into both nostrils daily.    ? L-Methylfolate 15 MG TABS Take 1 tablet (15 mg total) by mouth daily. 90 tablet 3  ? loratadine (CLARITIN) 10 MG tablet Take 10 mg by mouth daily as needed for allergies.    ? LORazepam (ATIVAN) 0.5 MG tablet Take 1 tablet (0.5 mg total) by mouth every 8 (eight) hours as needed for anxiety. 30 tablet 0  ? rosuvastatin (CRESTOR) 10 MG tablet Take 10 mg by mouth daily.    ? Vitamin D, Cholecalciferol, 50 MCG (2000 UT) CAPS Take 1 tablet by mouth daily.    ? azelastine (ASTELIN) 0.1 % nasal spray azelastine 137 mcg (0.1 %) nasal spray aerosol ? [place] [facility] [place]AY [place] [facility]  [place] TWICE A DAY (Patient not taking: No sig reported)    ? FLUoxetine (PROZAC) 20 MG capsule Take 3 capsules (60 mg total) by mouth daily. 270 capsule 3  ? Methylfol-Methylcob-Acetylcyst (CEREFOLIN NAC) 6-2-600 MG TABS Take 1 tablet by mouth daily. (Patient not taking: Reported on 11/11/2021) 90 tablet 3  ? ?No current facility-administered medications for this visit.  ? ? ?Medication  Side Effects: None, unless some sexual ? ?Allergies:  ?Allergies  ?Allergen Reactions  ? Iodine Hives  ?  Topical/ hives  ? ? ?Past Medical History:  ?Diagnosis Date  ? Anxiety disorder   ? Headache   ? Low blood pressure 02/23/2015  ? Malaise and fatigue 07/07/2012  ? OCD (obsessive compulsive disorder) 05/29/2011  ? Palpitations 05/29/2011  ? ? ?Family History  ?Problem Relation Age of Onset  ? Alzheimer's disease Mother   ? Pulmonary embolism Father   ? Anxiety disorder Sister   ? Diabetes Maternal Grandmother   ? Parkinson's disease Maternal Grandmother   ? ? ?Social History  ? ?Socioeconomic History  ? Marital status: Married  ?  Spouse name: Not on file  ? Number of children: 3  ? Years of education: Masters   ? Highest education level: Not on file  ?Occupational History  ? Occupation: N/A  ?Tobacco Use  ? Smoking status: Never  ? Smokeless tobacco: Never  ?Substance and Sexual Activity  ? Alcohol use: Yes  ?  Comment: Occ  ? Drug use: No  ? Sexual activity: Not on file  ?Other Topics Concern  ? Not on file  ?Social History Narrative  ? Rare caffeine use   ? ?Social Determinants of Health  ? ?Financial Resource Strain: Not on file  ?Food Insecurity: Not on file  ?Transportation Needs: Not on file  ?Physical Activity: Not on file  ?Stress: Not on file  ?Social Connections: Not on file  ?Intimate Partner Violence: Not on file  ? ? ?Past Medical History, Surgical history, Social history, and Family history were reviewed and updated as appropriate.  ? ?Please see review of systems for further details on the patient's review from today.  ? ?Objective:  ? ?Physical Exam:  ?LMP  (LMP Unknown)  ? ?Physical Exam ?Constitutional:   ?   General: She is not in acute distress. ?   Appearance: She is well-developed.  ?Musculoskeletal:     ?   General: No deformity.  ?Neurological:  ?   Mental Status: She is alert and oriented to person, place, and time.  ?   Motor: No tremor.  ?   Coordination: Coordination normal.  ?   Gait:  Gait normal.  ?Psychiatric:     ?   Attention and Perception: Attention normal. She is attentive.     ?   Mood and Affect: Mood normal. Mood is not anxious or depressed. Affect is not labile, blunt, angry or inappropriate.     ?   Speech: Speech normal. Speech is not slurred.     ?   Behavior: Behavior normal.     ?   Thought Content: Thought content normal. Thought content is not paranoid or delusional. Thought content does not include homicidal or suicidal ideation. Thought content does not include suicidal plan.     ?   Cognition and Memory: Cognition normal.     ?   Judgment: Judgment normal.  ?   Comments: Insight is good. ?Minimal OCD sx further improvement  ? ? ?Lab Review:  ?   ?  Component Value Date/Time  ? NA 136 11/17/2016 1930  ? K 3.7 11/17/2016 1930  ? CL 102 11/17/2016 1930  ? CO2 28 11/17/2016 1930  ? GLUCOSE 88 11/17/2016 1930  ? BUN 22 (H) 11/17/2016 1930  ? CREATININE 0.78 11/17/2016 1930  ? CALCIUM 9.3 11/17/2016 1930  ? PROT 7.1 07/07/2012 1222  ? ALBUMIN 3.8 07/07/2012 1222  ? AST 16 07/07/2012 1222  ? ALT 15 07/07/2012 1222  ? ALKPHOS 50 07/07/2012 1222  ? BILITOT 0.9 07/07/2012 1222  ? GFRNONAA >60 11/17/2016 1930  ? GFRAA >60 11/17/2016 1930  ? ? ?   ?Component Value Date/Time  ? WBC 5.5 11/17/2016 1930  ? RBC 4.65 11/17/2016 1930  ? HGB 14.5 11/17/2016 1930  ? HGB 14.5 06/30/2007 1027  ? HCT 40.9 11/17/2016 1930  ? HCT 41.5 06/30/2007 1027  ? PLT 257 11/17/2016 1930  ? PLT 236 06/30/2007 1027  ? MCV 88.0 11/17/2016 1930  ? MCV 87.1 06/30/2007 1027  ? MCH 31.2 11/17/2016 1930  ? MCHC 35.5 11/17/2016 1930  ? RDW 12.8 11/17/2016 1930  ? RDW 10.3 (L) 06/30/2007 1027  ? LYMPHSABS 1.1 07/07/2012 1222  ? LYMPHSABS 1.9 06/30/2007 1027  ? MONOABS 0.6 07/07/2012 1222  ? MONOABS 0.4 06/30/2007 1027  ? EOSABS 0.1 07/07/2012 1222  ? EOSABS 0.1 06/30/2007 1027  ? BASOSABS 0.0 07/07/2012 1222  ? BASOSABS 0.1 06/30/2007 1027  ? ? ?No results found for: POCLITH, LITHIUM  ? ?No results found for: PHENYTOIN,  PHENOBARB, VALPROATE, CBMZ  ? ?.res ?Assessment: Plan:   ? ?Mixed obsessional thoughts and acts - Plan: FLUoxetine (PROZAC) 20 MG capsule ? ?Panic disorder with agoraphobia - Plan: FLUoxetine (PROZAC) 20

## 2021-11-16 ENCOUNTER — Other Ambulatory Visit: Payer: Self-pay

## 2021-11-16 ENCOUNTER — Ambulatory Visit
Admission: RE | Admit: 2021-11-16 | Discharge: 2021-11-16 | Disposition: A | Discharge status: Home / Self Care | Payer: BC Managed Care – PPO | Source: Ambulatory Visit | Attending: Sports Medicine | Admitting: Sports Medicine

## 2021-11-16 DIAGNOSIS — M25462 Effusion, left knee: Secondary | ICD-10-CM | POA: Diagnosis not present

## 2021-11-16 DIAGNOSIS — R2242 Localized swelling, mass and lump, left lower limb: Secondary | ICD-10-CM

## 2021-11-16 DIAGNOSIS — S83232A Complex tear of medial meniscus, current injury, left knee, initial encounter: Secondary | ICD-10-CM | POA: Diagnosis not present

## 2021-11-16 DIAGNOSIS — M7122 Synovial cyst of popliteal space [Baker], left knee: Secondary | ICD-10-CM | POA: Diagnosis not present

## 2021-12-20 DIAGNOSIS — Z1231 Encounter for screening mammogram for malignant neoplasm of breast: Secondary | ICD-10-CM | POA: Diagnosis not present

## 2021-12-27 DIAGNOSIS — J0110 Acute frontal sinusitis, unspecified: Secondary | ICD-10-CM | POA: Diagnosis not present

## 2022-01-16 DIAGNOSIS — Z130 Encounter for screening for diseases of the blood and blood-forming organs and certain disorders involving the immune mechanism: Secondary | ICD-10-CM | POA: Diagnosis not present

## 2022-01-16 DIAGNOSIS — Z01419 Encounter for gynecological examination (general) (routine) without abnormal findings: Secondary | ICD-10-CM | POA: Diagnosis not present

## 2022-01-16 DIAGNOSIS — Z1389 Encounter for screening for other disorder: Secondary | ICD-10-CM | POA: Diagnosis not present

## 2022-01-16 DIAGNOSIS — Z780 Asymptomatic menopausal state: Secondary | ICD-10-CM | POA: Diagnosis not present

## 2022-03-25 DIAGNOSIS — N898 Other specified noninflammatory disorders of vagina: Secondary | ICD-10-CM | POA: Diagnosis not present

## 2022-03-25 DIAGNOSIS — R350 Frequency of micturition: Secondary | ICD-10-CM | POA: Diagnosis not present

## 2022-06-05 IMAGING — CT CT CHEST W/O CM
2 of 4 series · 15 of 36 positions shown, 18 images · non-contrast
Comparison: 09/05/2019 cardiac CT.

CLINICAL DATA: Follow-up of pulmonary nodules. Asymptomatic. Never
smoker.

EXAM:
CT CHEST WITHOUT CONTRAST
TECHNIQUE: Multidetector CT imaging of the chest was performed following the
standard protocol without IV contrast.

[Series 2: thorax · axial · 0.67mm/px · z∈[+1465,+1733]mm · 12 of 160 slices shown, 15 images]
[im 13/160  mediastinal]
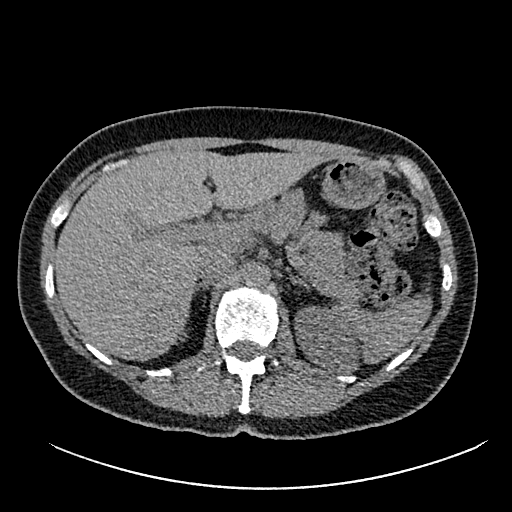
[im 13/160  lung]
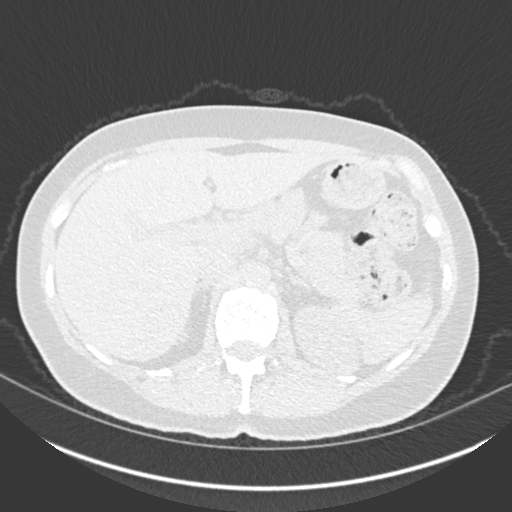
[im 25/160  lung]
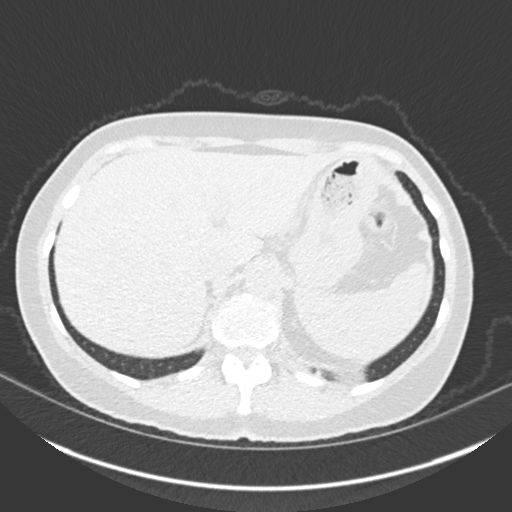
[im 37/160  lung]
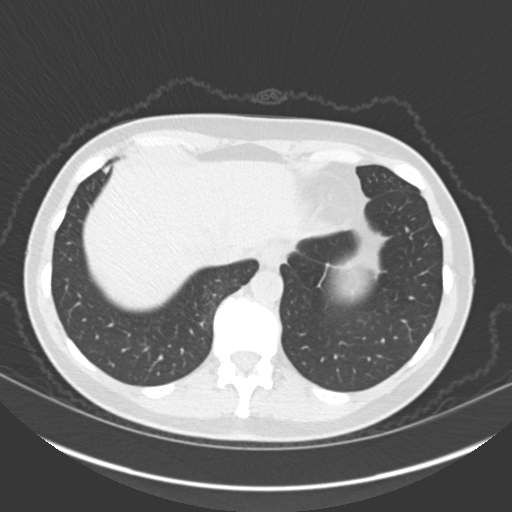
[im 49/160  lung]
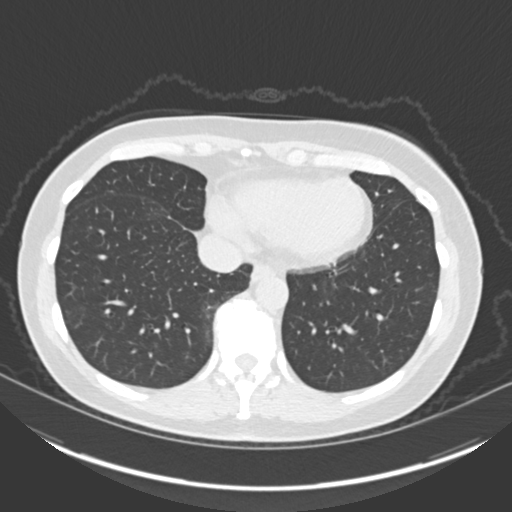
[im 62/160  mediastinal]
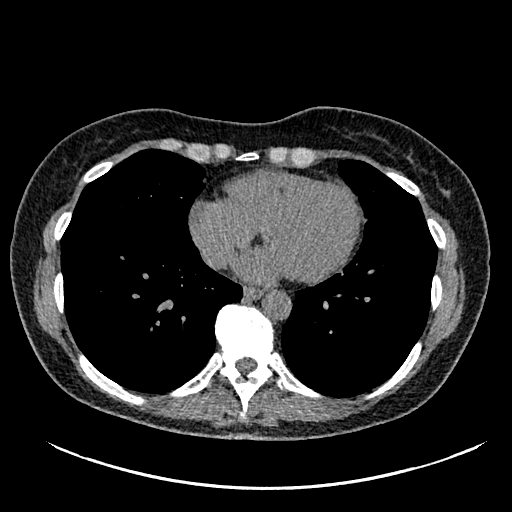
[im 62/160  lung]
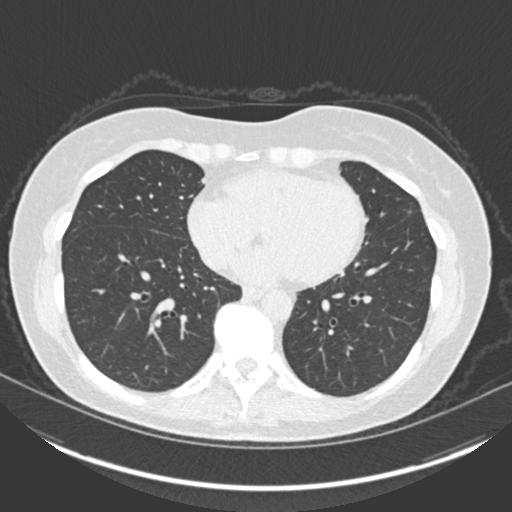
[im 74/160  lung]
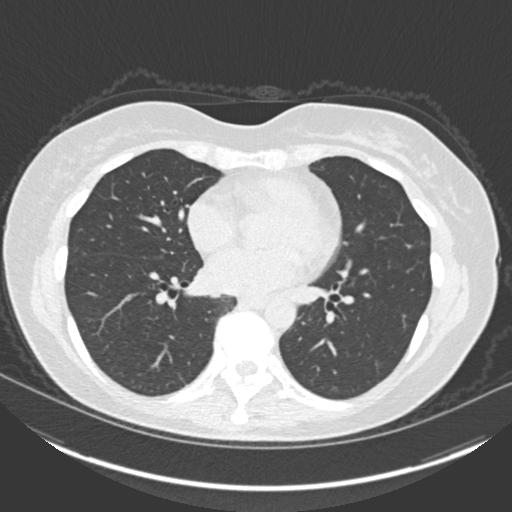
[im 86/160  lung]
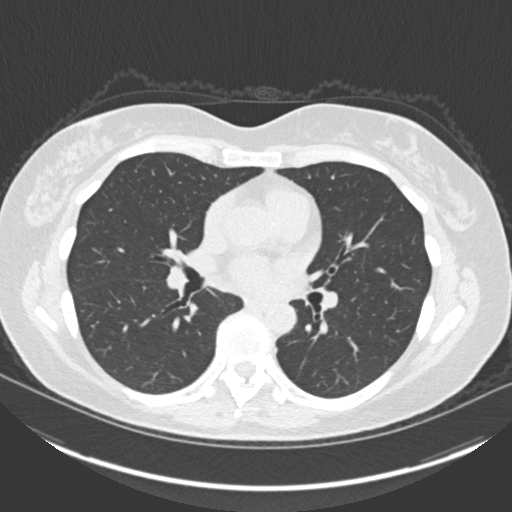
[im 98/160  lung]
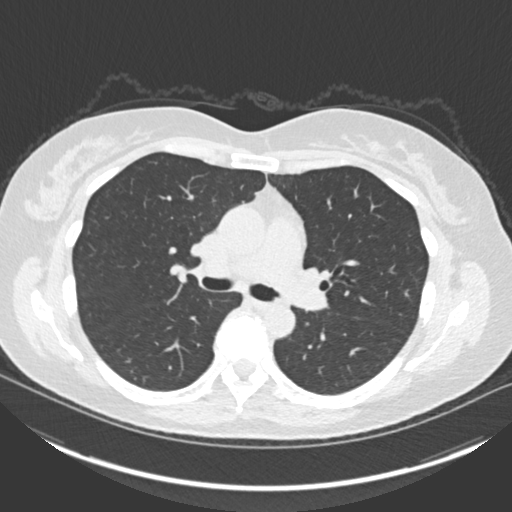
[im 111/160  mediastinal]
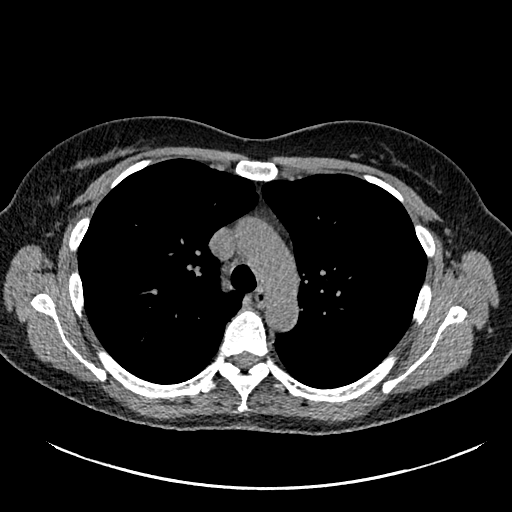
[im 111/160  lung]
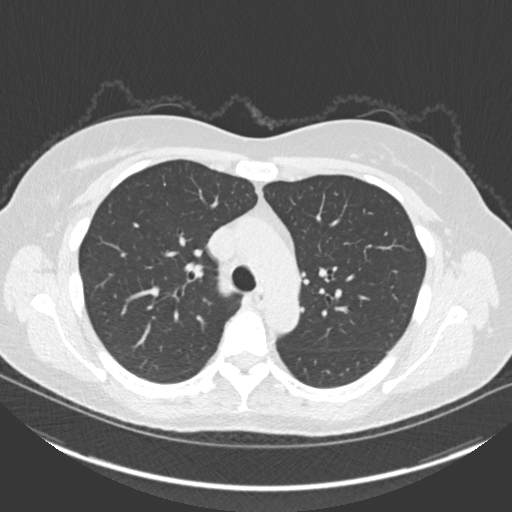
[im 123/160  lung]
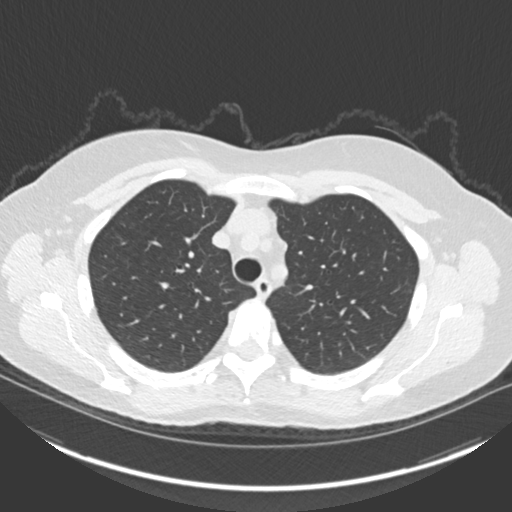
[im 135/160  lung]
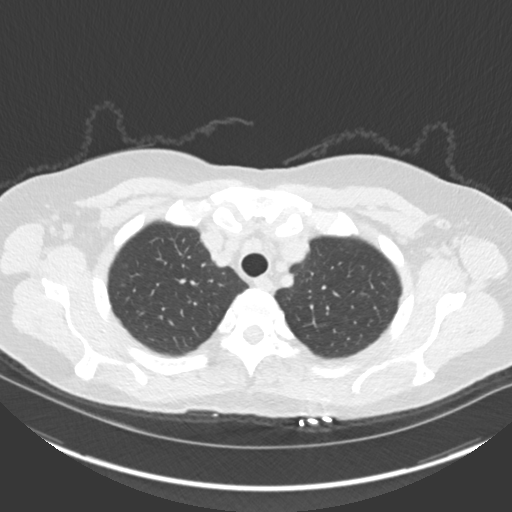
[im 147/160  lung]
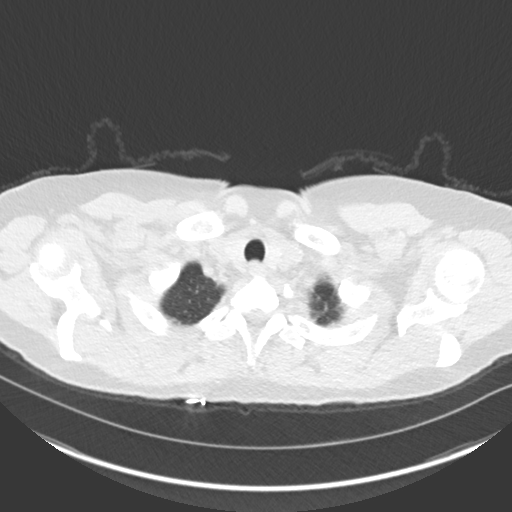

[Series 6: coronal · coronal · 0.62mm/px · 3 of 116 slices shown]
[im 24/116  lung]
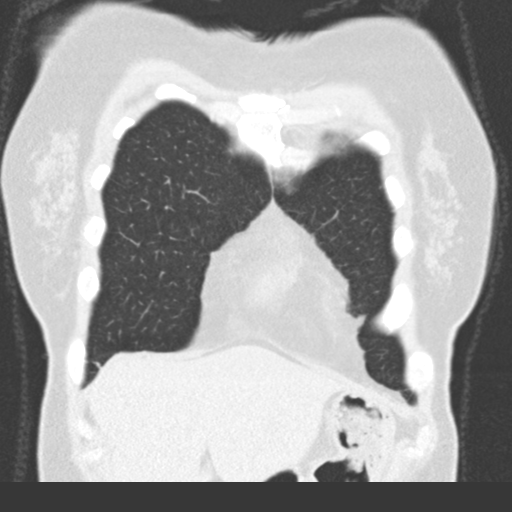
[im 47/116  lung]
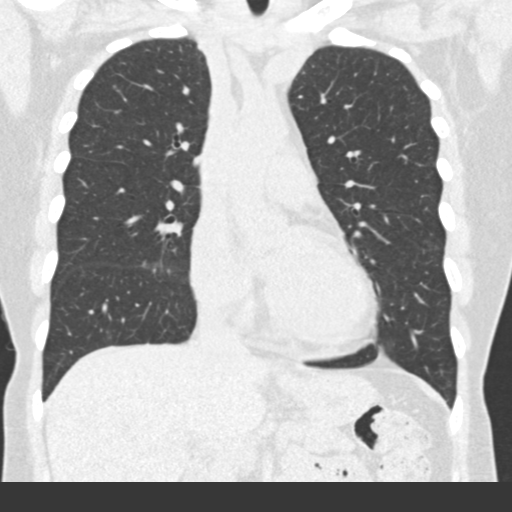
[im 70/116  lung]
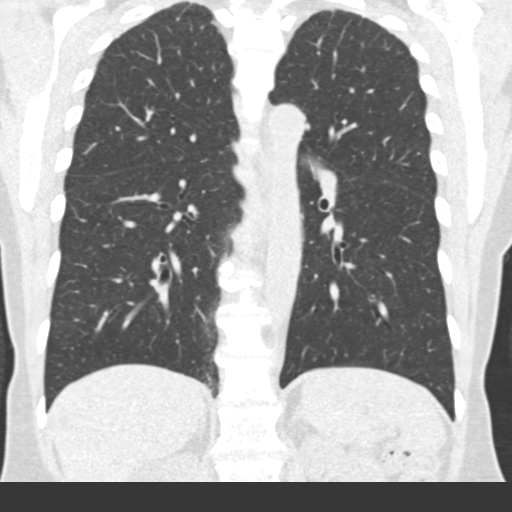

[15 of 36 positions shown; findings below may reference images not displayed]

FINDINGS: Cardiovascular: Normal aortic caliber. Normal heart size, without
pericardial effusion.

Mediastinum/Nodes: No mediastinal or definite hilar adenopathy,
given limitations of unenhanced CT.

Lungs/Pleura: No pleural fluid. The right middle lobe 3 mm pulmonary
nodule is similar, including on 95/5. The other smaller left-sided
pulmonary nodules are not identified.

No new or enlarging nodules.

Upper Abdomen: Well-circumscribed low-density liver lesions are
likely cysts. Maximally 9 mm. Normal imaged portions of the spleen,
stomach, pancreas, adrenal glands, left kidney. Upper pole right
renal 2.1 cm fluid density lesion is incompletely imaged but likely
a cyst.

Musculoskeletal: No acute osseous abnormality.
IMPRESSION: 1. The right middle lobe pulmonary nodule is similar and can be
presumed benign. The other smaller left-sided pulmonary nodules are
no longer identified.
2.  No acute process in the chest.

## 2022-08-08 DIAGNOSIS — J019 Acute sinusitis, unspecified: Secondary | ICD-10-CM | POA: Diagnosis not present

## 2022-08-08 DIAGNOSIS — M25562 Pain in left knee: Secondary | ICD-10-CM | POA: Diagnosis not present

## 2022-08-21 DIAGNOSIS — M791 Myalgia, unspecified site: Secondary | ICD-10-CM | POA: Diagnosis not present

## 2022-09-01 DIAGNOSIS — M791 Myalgia, unspecified site: Secondary | ICD-10-CM | POA: Diagnosis not present

## 2022-09-01 DIAGNOSIS — R350 Frequency of micturition: Secondary | ICD-10-CM | POA: Diagnosis not present

## 2022-11-17 ENCOUNTER — Encounter: Payer: Self-pay | Admitting: Psychiatry

## 2022-11-17 ENCOUNTER — Ambulatory Visit: Payer: BC Managed Care – PPO | Admitting: Psychiatry

## 2022-11-17 DIAGNOSIS — F4001 Agoraphobia with panic disorder: Secondary | ICD-10-CM

## 2022-11-17 DIAGNOSIS — Z8659 Personal history of other mental and behavioral disorders: Secondary | ICD-10-CM

## 2022-11-17 DIAGNOSIS — F422 Mixed obsessional thoughts and acts: Secondary | ICD-10-CM | POA: Diagnosis not present

## 2022-11-17 MED ORDER — FLUOXETINE HCL 20 MG PO CAPS: 60.0000 mg | ORAL_CAPSULE | Freq: Every day | ORAL | 3 refills | Status: DC

## 2022-11-17 NOTE — Progress Notes (Addendum)
SYLIVIA LYON [id number] [birth year] 67 y.o.  Subjective:   Patient ID:  Christine [name] is a 67 y.o. (DOB 12/25/1955) female.  Chief Complaint:  Chief Complaint  Patient presents with   Follow-up   Anxiety    HPI Christine [name] presents to the office today for follow-up of OCD, panic, dysthymia.  Last seen 09/2019.Marland Kitchen  No meds changed.  10/03/2019 appt noted: No Covid.  Good year with mental health.  Rarely overly anxious with obsessions.  No bothersome SE except less sexual sensation.  Pleased with fluoxetine 60.  Doesn't want to change.   09/2020 appt noted: Doing well.  No complaints.  Patient reports stable mood and denies depressed or irritable moods.  Patient denies any recent difficulty with anxiety. No sig panic.  Moments anxiety driving.  Patient denies difficulty with sleep initiation or maintenance. Denies appetite disturbance.  Patient reports that energy and motivation have been good.  CO some brain fog decisions.  Patient denies any suicidal ideation.  Aware can obsess under stress if not managed.    OCD manageable.  End of summer more near panic driving but resolved. Still some concern with word finding. Stay on fluoxetine 60 mg and Cerefolin NAC  11/11/2021 appointment with the following noted: Everything is still the same and doing fine except driving anxiety is still a problems.  Could use some counseling.  Misses Fred May. Consistent with fluoxetine 60 mg daily. No SE. Good health. Patient reports stable mood and denies depressed or irritable moods.    Patient denies difficulty with sleep initiation or maintenance more than ususal. Denies appetite disturbance.  Patient reports that energy and motivation have been good.  Patient denies any difficulty with concentration.  Patient denies any suicidal ideation. Off NAC.  Was forgetting it. Ask abt Xanax for driving.  [id number] appt noted: Doing really well. Occ driving anxiety but manageable. On fluoxetine 60 mg daily. No  NAC lately.  No SE.   Patient reports stable mood and denies depressed or irritable moods.  Patient denies any recent difficulty with anxiety.  Patient denies difficulty with sleep initiation or maintenance. Denies appetite disturbance.  Patient reports that energy and motivation have been good.  Patient denies any difficulty with concentration.  Patient denies any suicidal ideation. Living a much more normal ife. H retired and doing well. Not markedly emotionally blunted.  Can cry if appropriate.  But used t o be fearful and not longer is fearful and is more spiritually grounded.   Feels like maturity in faith. Kids in their 49s.    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  On SSRI for decades.  Question about statins and mental health.  Past Psychiatric Medication Trials: Fluoxetine 60 since 2013, Lexapro, duloxetine, sertraline, risperidone depression, Abilify depression  All 3 kids with anxiety.  Oldest D tried several meds and started fluoxetine and worked well.  Review of Systems:  Review of Systems  Cardiovascular:  Negative for palpitations.  Neurological:  Negative for tremors.  Psychiatric/Behavioral:  Negative for agitation, behavioral problems, confusion, decreased concentration, dysphoric mood, hallucinations, self-injury and sleep disturbance. The patient is not nervous/anxious and is not hyperactive.     Medications: I have reviewed the patient's current medications.  Current Outpatient Medications  Medication Sig Dispense Refill   aspirin EC 81 MG tablet Take 81 mg by mouth daily.     azelastine (ASTELIN) 0.1 % nasal spray azelastine 137 mcg (0.1 %) nasal spray aerosol  USE 1 SPRAY  IN EACH NOSTRIL TWICE A DAY     fluticasone (FLONASE) 50 MCG/ACT nasal spray Place 1 spray into both nostrils daily.     L-Methylfolate 15 MG TABS Take 1 tablet (15 mg total) by mouth daily. 90 tablet 3   loratadine (CLARITIN) 10 MG tablet Take 10 mg by mouth daily as  needed for allergies.     LORazepam (ATIVAN) 0.5 MG tablet Take 1 tablet (0.5 mg total) by mouth every 8 (eight) hours as needed for anxiety. 30 tablet 0   Methylfol-Methylcob-Acetylcyst (CEREFOLIN NAC) 6-2-600 MG TABS Take 1 tablet by mouth daily. 90 tablet 3   rosuvastatin (CRESTOR) 10 MG tablet Take 10 mg by mouth daily.     Vitamin D, Cholecalciferol, 50 MCG (2000 UT) CAPS Take 1 tablet by mouth daily.     FLUoxetine (PROZAC) 20 MG capsule Take 3 capsules (60 mg total) by mouth daily. 270 capsule 3   No current facility-administered medications for this visit.    Medication Side Effects: None, unless some sexual  Allergies:  Allergies  Allergen Reactions   Iodine Hives    Topical/ hives    Past Medical History:  Diagnosis Date   Anxiety disorder    Headache    Low blood pressure 02/23/2015   Malaise and fatigue 07/07/2012   OCD (obsessive compulsive disorder) 05/29/2011   Palpitations 05/29/2011    Family History  Problem Relation Age of Onset   Alzheimer's disease Mother    Pulmonary embolism Father    Anxiety disorder Sister    Diabetes Maternal Grandmother    Parkinson's disease Maternal Grandmother     Social History   Socioeconomic History   Marital status: Married    Spouse name: Not on file   Number of children: 3   Years of education: Masters    Highest education level: Not on file  Occupational History   Occupation: N/A  Tobacco Use   Smoking status: Never   Smokeless tobacco: Never  Substance and Sexual Activity   Alcohol use: Yes    Comment: Occ   Drug use: No   Sexual activity: Not on file  Other Topics Concern   Not on file  Social History Narrative   Rare caffeine use    Social Determinants of Health   Financial Resource Strain: Not on file  Food Insecurity: Not on file  Transportation Needs: Not on file  Physical Activity: Not on file  Stress: Not on file  Social Connections: Not on file  Intimate Partner Violence: Not on file     Past Medical History, Surgical history, Social history, and Family history were reviewed and updated as appropriate.   Please see review of systems for further details on the patient's review from today.   Objective:   Physical Exam:  LMP  (LMP Unknown)   Physical Exam Constitutional:      General: She is not in acute distress.    Appearance: She is well-developed.  Musculoskeletal:        General: No deformity.  Neurological:     Mental Status: She is alert and oriented to person, place, and time.     Motor: No tremor.     Coordination: Coordination normal.     Gait: Gait normal.  Psychiatric:        Attention and Perception: Attention normal. She is attentive.        Mood and Affect: Mood is anxious. Mood is not depressed. Affect is not labile, blunt, angry or inappropriate.  Speech: Speech normal. Speech is not slurred.        Behavior: Behavior normal.        Thought Content: Thought content normal. Thought content is not paranoid or delusional. Thought content does not include homicidal or suicidal ideation. Thought content does not include suicidal plan.        Cognition and Memory: Cognition normal.        Judgment: Judgment normal.     Comments: Insight is good. Minimal OCD sx further improvement Anxiety with driving     Lab Review:     Component Value Date/Time   NA 136 11/17/2016 1930   K 3.7 11/17/2016 1930   CL 102 11/17/2016 1930   CO2 28 11/17/2016 1930   GLUCOSE 88 11/17/2016 1930   BUN 22 (H) 11/17/2016 1930   CREATININE 0.78 11/17/2016 1930   CALCIUM 9.3 11/17/2016 1930   PROT 7.1 07/07/2012 1222   ALBUMIN 3.8 07/07/2012 1222   AST 16 07/07/2012 1222   ALT 15 07/07/2012 1222   ALKPHOS 50 07/07/2012 1222   BILITOT 0.9 07/07/2012 1222   GFRNONAA >60 11/17/2016 1930   GFRAA >60 11/17/2016 1930       Component Value Date/Time   WBC 5.5 11/17/2016 1930   RBC 4.65 11/17/2016 1930   HGB 14.5 11/17/2016 1930   HGB 14.5 06/30/2007 1027    HCT 40.9 11/17/2016 1930   HCT 41.5 06/30/2007 1027   PLT 257 11/17/2016 1930   PLT 236 06/30/2007 1027   MCV 88.0 11/17/2016 1930   MCV 87.1 06/30/2007 1027   MCH 31.2 11/17/2016 1930   MCHC 35.5 11/17/2016 1930   RDW 12.8 11/17/2016 1930   RDW 10.3 (L) 06/30/2007 1027   LYMPHSABS 1.1 07/07/2012 1222   LYMPHSABS 1.9 06/30/2007 1027   MONOABS 0.6 07/07/2012 1222   MONOABS 0.4 06/30/2007 1027   EOSABS 0.1 07/07/2012 1222   EOSABS 0.1 06/30/2007 1027   BASOSABS 0.0 07/07/2012 1222   BASOSABS 0.1 06/30/2007 1027    No results found for: "POCLITH", "LITHIUM"   No results found for: "PHENYTOIN", "PHENOBARB", "VALPROATE", "CBMZ"   .res Assessment: Plan:    Mixed obsessional thoughts and acts - Plan: FLUoxetine (PROZAC) 20 MG capsule  Panic disorder with agoraphobia - Plan: FLUoxetine (PROZAC) 20 MG capsule  Hx of dysthymia   Greater than 50% of 30 min face to face time with patient was spent on counseling and coordination of care. We discussed Very thankful for the last 7 years bc never remembers having this degree of peace.  Continue meds DT high risk relapse if stops meds.  No change indicated. Disc normal dosing for OCD. She doesn't want to take chance of lower dose to reduce sexual SE. Continue Prozac 60 mg daily.  Disc usual course of OCD and gradual improvement.  Residual panic and driving fears manageable.  Relapse prevention discussed.  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 Option restart L-methylfolate Option NAC retrial  We discussed the short-term risks associated with benzodiazepines including sedation and increased fall risk among others.  Discussed long-term side effect risk including dependence, potential withdrawal symptoms, and the potential eventual dose-related risk of dementia.  But recent studies from 2020 dispute this association between benzodiazepines and dementia risk. Newer studies in 2020 do not support an  association with dementia. Careful re driving with low dose lorazepam 0.5 mg prn.  She hasn't used yet.    FU 1 year  Lynder Parents, MD, DFAPA  Please see After Visit Summary for patient specific instructions.  No future appointments.  No orders of the defined types were placed in this encounter.     -------------------------------

## 2022-11-17 NOTE — Patient Instructions (Signed)
L-methylfolate 15 mg daily  (NAC) N-Acetylcysteine 2 of the  600 mg capsules daily to help with mild cognitive problems.

## 2022-12-22 DIAGNOSIS — R92333 Mammographic heterogeneous density, bilateral breasts: Secondary | ICD-10-CM | POA: Diagnosis not present

## 2022-12-22 DIAGNOSIS — Z1231 Encounter for screening mammogram for malignant neoplasm of breast: Secondary | ICD-10-CM | POA: Diagnosis not present

## 2022-12-31 DIAGNOSIS — F411 Generalized anxiety disorder: Secondary | ICD-10-CM | POA: Diagnosis not present

## 2022-12-31 DIAGNOSIS — J019 Acute sinusitis, unspecified: Secondary | ICD-10-CM | POA: Diagnosis not present

## 2022-12-31 DIAGNOSIS — Z131 Encounter for screening for diabetes mellitus: Secondary | ICD-10-CM | POA: Diagnosis not present

## 2022-12-31 DIAGNOSIS — E7841 Elevated Lipoprotein(a): Secondary | ICD-10-CM | POA: Diagnosis not present

## 2022-12-31 DIAGNOSIS — Z23 Encounter for immunization: Secondary | ICD-10-CM | POA: Diagnosis not present

## 2022-12-31 DIAGNOSIS — E7849 Other hyperlipidemia: Secondary | ICD-10-CM | POA: Diagnosis not present

## 2023-01-21 DIAGNOSIS — Z01419 Encounter for gynecological examination (general) (routine) without abnormal findings: Secondary | ICD-10-CM | POA: Diagnosis not present

## 2023-01-21 DIAGNOSIS — Z780 Asymptomatic menopausal state: Secondary | ICD-10-CM | POA: Diagnosis not present

## 2023-01-21 DIAGNOSIS — N9412 Deep dyspareunia: Secondary | ICD-10-CM | POA: Diagnosis not present

## 2023-01-21 DIAGNOSIS — Z130 Encounter for screening for diseases of the blood and blood-forming organs and certain disorders involving the immune mechanism: Secondary | ICD-10-CM | POA: Diagnosis not present

## 2023-01-21 DIAGNOSIS — E559 Vitamin D deficiency, unspecified: Secondary | ICD-10-CM | POA: Diagnosis not present

## 2023-01-24 DIAGNOSIS — R110 Nausea: Secondary | ICD-10-CM | POA: Diagnosis not present

## 2023-01-24 DIAGNOSIS — H6993 Unspecified Eustachian tube disorder, bilateral: Secondary | ICD-10-CM | POA: Diagnosis not present

## 2023-01-24 DIAGNOSIS — R059 Cough, unspecified: Secondary | ICD-10-CM | POA: Diagnosis not present

## 2023-01-24 DIAGNOSIS — J40 Bronchitis, not specified as acute or chronic: Secondary | ICD-10-CM | POA: Diagnosis not present

## 2023-02-10 DIAGNOSIS — J3489 Other specified disorders of nose and nasal sinuses: Secondary | ICD-10-CM | POA: Diagnosis not present

## 2023-02-10 DIAGNOSIS — T7840XA Allergy, unspecified, initial encounter: Secondary | ICD-10-CM | POA: Diagnosis not present

## 2023-05-18 DIAGNOSIS — R350 Frequency of micturition: Secondary | ICD-10-CM | POA: Diagnosis not present

## 2023-10-30 DIAGNOSIS — J209 Acute bronchitis, unspecified: Secondary | ICD-10-CM | POA: Diagnosis not present

## 2023-11-04 DIAGNOSIS — S161XXA Strain of muscle, fascia and tendon at neck level, initial encounter: Secondary | ICD-10-CM | POA: Diagnosis not present

## 2023-11-04 DIAGNOSIS — J0100 Acute maxillary sinusitis, unspecified: Secondary | ICD-10-CM | POA: Diagnosis not present

## 2023-11-05 ENCOUNTER — Ambulatory Visit (HOSPITAL_BASED_OUTPATIENT_CLINIC_OR_DEPARTMENT_OTHER)

## 2023-11-05 ENCOUNTER — Ambulatory Visit (HOSPITAL_BASED_OUTPATIENT_CLINIC_OR_DEPARTMENT_OTHER): Admitting: Student

## 2023-11-05 ENCOUNTER — Other Ambulatory Visit (HOSPITAL_BASED_OUTPATIENT_CLINIC_OR_DEPARTMENT_OTHER): Payer: Self-pay

## 2023-11-05 DIAGNOSIS — M542 Cervicalgia: Secondary | ICD-10-CM | POA: Diagnosis not present

## 2023-11-05 DIAGNOSIS — M47812 Spondylosis without myelopathy or radiculopathy, cervical region: Secondary | ICD-10-CM | POA: Diagnosis not present

## 2023-11-05 MED ORDER — MELOXICAM 15 MG PO TABS
15.0000 mg | ORAL_TABLET | Freq: Every day | ORAL | 0 refills | Status: AC
  Filled 2023-11-05: qty 10, 10d supply, fill #0

## 2023-11-05 NOTE — Progress Notes (Signed)
 Chief Complaint: Neck pain     History of Present Illness:    Christine [name] is a 68 y.o. female presenting today for evaluation of acute neck pain and stiffness.  She states that this began when she woke up yesterday without any known precipitating injury or cause.  She was diagnosed with with mild disc bulges at multiple levels within the cervical spine on an MRI back in 2018.  She reports that this gradually improved without significant intervention.  Reports today that the pain is located on both sides of the neck and does not have any pain, numbness, or tingling in the upper extremities.  She has been having more frequent headaches recently.  She was seen in primary care yesterday and was started on a muscle relaxer and ibuprofen which has given some relief thus far.  She is right-hand dominant.   Surgical History:   None  PMH/PSH/Family History/Social History/Meds/Allergies:    Past Medical History:  Diagnosis Date   Anxiety disorder    Headache    Low blood pressure 02/23/2015   Malaise and fatigue 07/07/2012   OCD (obsessive compulsive disorder) 05/29/2011   Palpitations 05/29/2011   Past Surgical History:  Procedure Laterality Date   CESAREAN SECTION     NO PAST SURGERIES     Social History   Socioeconomic History   Marital status: Married    Spouse name: Not on file   Number of children: 3   Years of education: Masters    Highest education level: Not on file  Occupational History   Occupation: N/A  Tobacco Use   Smoking status: Never   Smokeless tobacco: Never  Substance and Sexual Activity   Alcohol use: Yes    Comment: Occ   Drug use: No   Sexual activity: Not on file  Other Topics Concern   Not on file  Social History Narrative   Rare caffeine use    Social Drivers of Health   Financial Resource Strain: Not on file  Food Insecurity: Not on file  Transportation Needs: Not on file  Physical Activity: Not on file   Stress: Not on file  Social Connections: Unknown (12/22/2021)   Received from [facility], [facility]   Social Network    Social Network: Not on file   Family History  Problem Relation Age of Onset   Alzheimer's disease Mother    Pulmonary embolism Father    Anxiety disorder Sister    Diabetes Maternal Grandmother    Parkinson's disease Maternal Grandmother    Allergies  Allergen Reactions   Iodine Hives    Topical/ hives   Current Outpatient Medications  Medication Sig Dispense Refill   meloxicam (MOBIC) 15 MG tablet Take 1 tablet (15 mg total) by mouth daily for 10 days. 10 tablet 0   aspirin EC 81 MG tablet Take 81 mg by mouth daily.     azelastine (ASTELIN) 0.1 % nasal spray azelastine 137 mcg (0.1 %) nasal spray aerosol  USE 1 SPRAY IN EACH NOSTRIL TWICE A DAY     FLUoxetine (PROZAC) 20 MG capsule Take 3 capsules (60 mg total) by mouth daily. 270 capsule 3   fluticasone (FLONASE) 50 MCG/ACT nasal spray Place 1 spray into both nostrils daily.     L-Methylfolate 15 MG TABS Take 1 tablet (  15 mg total) by mouth daily. 90 tablet 3   loratadine (CLARITIN) 10 MG tablet Take 10 mg by mouth daily as needed for allergies.     LORazepam (ATIVAN) 0.5 MG tablet Take 1 tablet (0.5 mg total) by mouth every 8 (eight) hours as needed for anxiety. 30 tablet 0   Methylfol-Methylcob-Acetylcyst (CEREFOLIN NAC) 6-2-600 MG TABS Take 1 tablet by mouth daily. 90 tablet 3   rosuvastatin (CRESTOR) 10 MG tablet Take 10 mg by mouth daily.     Vitamin D, Cholecalciferol, 50 MCG (2000 UT) CAPS Take 1 tablet by mouth daily.     No current facility-administered medications for this visit.   No results found.  Review of Systems:   A ROS was performed including pertinent positives and negatives as documented in the HPI.  Physical Exam :   Constitutional: NAD and appears stated age Neurological: Alert and oriented Psych: Appropriate affect and cooperative There were no vitals taken for this  visit.   Comprehensive Musculoskeletal Exam:    Tenderness in the cervical paraspinal muscles and upper trapezius bilaterally.  No midline tenderness.  Active cervical range of motion is notably limited in extension and rotation bilaterally.  Negative Spurling's.  Grip strength 5/5 bilaterally.  Imaging:   Xray (cervical spine 4 views): Negative for acute bony abnormality.  Multilevel disc space loss and osteophytes noted at the C5-C6 and C6-C7 levels.   I personally reviewed and interpreted the radiographs.   Assessment:   68 y.o. female with acute pain in the neck and upper back that began yesterday morning.  No injury or her known cause.  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  Pain today presents more muscular in nature and she does not have any radicular type symptoms into the upper extremities.  In discussion of treatment plans I have recommended continuing with muscle relaxer as this has given her some relief.  I will switch her over to meloxicam and encouraged gentle stretching and ice/heat therapy.  I would like to get her in for some physical therapy to help her regain range of motion and address muscular tension throughout the back of the neck.  Can plan to have her return as needed, particularly if symptoms persist or worsen.  Plan :    -Continue muscle relaxer and start meloxicam 15 mg -Referral to physical therapy     I personally saw and evaluated the patient, and participated in the management and treatment plan.  Hazle Nordmann, PA-C Orthopedics

## 2023-11-06 NOTE — Addendum Note (Signed)
 Addended by: Jeanella Cara on: 11/06/2023 08:36 AM   Modules accepted: Orders

## 2023-11-18 ENCOUNTER — Encounter: Payer: Self-pay | Admitting: Psychiatry

## 2023-11-18 ENCOUNTER — Ambulatory Visit (INDEPENDENT_AMBULATORY_CARE_PROVIDER_SITE_OTHER): Payer: BC Managed Care – PPO | Admitting: Psychiatry

## 2023-11-18 DIAGNOSIS — F422 Mixed obsessional thoughts and acts: Secondary | ICD-10-CM

## 2023-11-18 DIAGNOSIS — F4001 Agoraphobia with panic disorder: Secondary | ICD-10-CM

## 2023-11-18 MED ORDER — FLUOXETINE HCL 20 MG PO CAPS: 60.0000 mg | ORAL_CAPSULE | Freq: Every day | ORAL | 3 refills | Status: AC
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
## 2023-11-23 ENCOUNTER — Ambulatory Visit: Admitting: Rehabilitative and Restorative Service Providers"

## 2023-12-14 NOTE — Therapy (Signed)
 OUTPATIENT PHYSICAL THERAPY EVALUATION   Patient Name: Christine [name] MRN: [id number] DOB:May 07, 1956, 68 y.o., female Today's Date: 12/16/2023  END OF SESSION:  PT End of Session - 12/16/23 1510     Visit Number 1    Number of Visits 20    Date for PT Re-Evaluation 02/24/24    Authorization Type [employer] 20% coinsurance    Progress Note Due on Visit 10    PT Start Time 1510    PT Stop Time 1600    PT Time Calculation (min) 50 min    Activity Tolerance Patient tolerated treatment well    Behavior During Therapy [facility] for tasks assessed/performed             Past Medical History:  Diagnosis Date   Anxiety disorder    Headache    Low blood pressure 02/23/2015   Malaise and fatigue 07/07/2012   OCD (obsessive compulsive disorder) 05/29/2011   Palpitations 05/29/2011   Past Surgical History:  Procedure Laterality Date   CESAREAN SECTION     NO PAST SURGERIES     Patient Active Problem List   Diagnosis Date Noted   Panic disorder with agoraphobia 08/31/2018   Dysthymia 08/31/2018   Low blood pressure 02/23/2015   Malaise and fatigue 07/07/2012   OCD (obsessive compulsive disorder) 05/29/2011   Palpitations 05/29/2011    PCP: Helyn Lobstein MD  REFERRING PROVIDER: Wilhelmenia Harada, MD  REFERRING DIAG: M54.2 (ICD-10-CM) - Cervicalgia  THERAPY DIAG:  Cervicalgia  Abnormal posture  Rationale for Evaluation and Treatment: Rehabilitation  ONSET DATE: Around 11/04/2023  SUBJECTIVE:                                                                                                                                                                                                         SUBJECTIVE STATEMENT: Pt indicated having onset of symptoms in early to middle March. Pt indicated having the flu/illness that then led to sinus infection.  Pt indicated waking one morning with pain with movements and shock /sharp pain noted.  Pt indicated seeing regular MD with checkout with  medicine for illness and muscle relaxer for neck.  Pt indicated still getting worsen and led to seeing ortho MD.  Pt indicated getting z pack that seemed to help.  Having some complaints with sleeping and tension noted during the day with activity.  Noticed some crepitus.  Reported prolonged sitting looking down was troublesome.   Pt indicated also having some complaints with Rt shoulder/into arm (was present before neck pain).   Denied numbness/tingling complaints.   Aaron Aas  PERTINENT HISTORY:  MRI 2018 showe mild disc buldges History of headaches Rt hand dominant  PAIN:  NPRS scale: at worst in last week 8/10, current arrival 0/10 Pain location: neck Pain description: tightness/stiff recently Aggravating factors: increased lifting, carrying, arm use Relieving factors: heat, hot shower, ibuprofen   PRECAUTIONS: None  RED FLAGS: None  WEIGHT BEARING RESTRICTIONS: No  FALLS:  Has patient fallen in last 6 months? 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    OBJECTIVE:   PATIENT SURVEYS: Patient-Specific Activity Scoring Scheme  "0" represents "unable to perform." "10" represents "able to perform at prior level. 0 1 2 3 4 5 6 7 8 9  10 (Date and Score)   Activity Eval 12/16/2023    1. Lifting/caring for grandkids  8    2. Sleep  8    3.     4.    5.    Score 8 avg    Total score = sum of the activity scores/number of activities Minimum detectable change (90%CI) for average score = 2 points Minimum detectable change (90%CI) for single activity score = 3 points  COGNITION: 12/16/2023 Overall cognitive status: Within functional limits for tasks assessed  SENSATION: 12/16/2023 [facility]  POSTURE:  12/16/2023 Mild rounded shoulders,  FHP  PALPATION: 12/16/2023 Trigger points noted in bilateral upper trap, infraspinatus  Supine cervical downslope passive accessory mobility WFL.    CERVICAL ROM:   ROM AROM (deg) 12/16/2023  Flexion 50  Extension 50  Right lateral flexion   Left lateral flexion   Right rotation 80 c Lt neck tightness  Left rotation 70 c Lt neck tightness   (Blank rows = not tested)  UPPER EXTREMITY MMT:   ROM Right 12/16/2023 Left 12/16/2023  Shoulder flexion 5/5 5/5  Shoulder extension    Shoulder abduction 5/5 5/5  Shoulder adduction    Shoulder extension    Shoulder internal rotation 5/5 5/5  Shoulder external rotation 5/5 c mild pain 5/5  Elbow flexion    Elbow extension    Wrist flexion    Wrist extension    Wrist ulnar deviation    Wrist radial deviation    Wrist pronation    Wrist supination     (Blank rows = not tested)  UPPER EXTREMITY ROM:  MMT Right 12/16/2023 Left 12/16/2023  Shoulder flexion [facility]  Shoulder extension    Shoulder abduction    Shoulder adduction    Shoulder extension    Shoulder internal rotation    Shoulder external rotation    Middle trapezius    Lower trapezius    Elbow flexion    Elbow extension    Wrist flexion    Wrist extension    Wrist ulnar deviation    Wrist radial deviation    Wrist pronation    Wrist supination    Grip strength     (Blank rows = not tested)  SPECIAL TESTS:  12/16/2023 (-) compression spurlings.  (+) relief c distraction for neck tightness.   FUNCTIONAL TESTS:  12/16/2023 No specific testing.  TODAY'S TREATMENT:                                                                                                       DATE:  12/16/2023 Therex:    HEP instruction/performance c cues for techniques, handout provided.  Trial set performed of  each for comprehension and symptom assessment.  See below for exercise list. Additional time spent in review.   Manual Supine cervical distraction intermittent holds.  Seated bilateral upper trap, infraspinatus percussive device with education for use at home for trigger point relief.   Self Care Education on heat/ice use, possible massage usage as desired.  Exercise review with pain monitoring scale for during or after.   PATIENT EDUCATION:  12/16/2023 Education details: HEP, POC Person educated: Patient Education method: Explanation, Demonstration, Verbal cues, and Handouts Education comprehension: verbalized understanding, returned demonstration, and verbal cues required  HOME EXERCISE PROGRAM: Access Code: [email or web address] URL: [email or web address] Date: 12/16/2023 Prepared by: Bonna Bustard  Exercises - Supine Cervical Retraction with Towel  - 2-3 x daily - 7 x weekly - 1 sets - 10 reps - 5 hold - Cervical Retraction at Wall  - 2-3 x daily - 7 x weekly - 1 sets - 10 reps - 5 hold - Prone Scapular Retraction  - 1 x daily - 7 x weekly - 1 sets - 10 reps - 5 hold - Prone Scapular Slide with Shoulder Extension  - 1 x daily - 7 x weekly - 1 sets - 10 reps - 5 hold - Prone Scapular Retraction Arms at Side  - 1 x daily - 7 x weekly - 1 sets - 10 reps - 5 hold - Seated Scapular Retraction  - 3-5 x daily - 7 x weekly - 1 sets - 5-10 reps - 3-5 hold - Seated Upper Trapezius Stretch  - 2-3 x daily - 7 x weekly - 1 sets - 5 reps - 15 hold  ASSESSMENT:  CLINICAL IMPRESSION: Patient is a 68 y.o. who comes to clinic with complaints of cervical pain with mobility, strength and movement coordination deficits that impair their ability to perform usual daily and recreational functional activities without increase difficulty/symptoms at this time.  Patient to benefit from skilled PT services to address impairments and limitations to improve to previous level of function without restriction  secondary to condition.    OBJECTIVE IMPAIRMENTS: decreased activity tolerance, decreased coordination, decreased endurance, decreased mobility, decreased ROM, decreased strength, increased fascial restrictions, impaired perceived functional ability, increased muscle spasms, impaired flexibility, impaired UE functional use, improper body mechanics, postural dysfunction, and pain.   ACTIVITY LIMITATIONS: carrying, lifting, bending, sitting, sleeping, reach over head, and caring for others  PARTICIPATION LIMITATIONS: meal prep, cleaning, laundry, interpersonal relationship, community activity, and yard work  PERSONAL FACTORS:  no specific factors  are also affecting patient's functional outcome.   REHAB POTENTIAL: Good  CLINICAL DECISION MAKING: Stable/uncomplicated  EVALUATION COMPLEXITY: Low   GOALS: Goals reviewed with patient? Yes  SHORT TERM GOALS: (target date for Short term goals are 3 weeks 01/06/2024)  1.Patient will demonstrate independent  use of home exercise program to maintain progress from in clinic treatments. Goal status: New  LONG TERM GOALS: (target dates for all long term goals are 10 weeks  02/24/2024)   1. Patient will demonstrate/report pain at worst less than or equal to 2/10 to facilitate minimal limitation in daily activity secondary to pain symptoms. Goal status: New   2. Patient will demonstrate independent use of home exercise program to facilitate ability to maintain/progress functional gains from skilled physical therapy services. Goal status: New   3. Patient will demonstrate Patient specific functional scale avg > or = 10 to indicate reduced disability due to condition.  Goal status: New   4.  Patient will demonstrate cervical AROM WFL s symptoms to facilitate usual head movements for daily activity including driving, self care.   Goal status: New   5.  Patient will demonstrate/report ability to perform usual workouts at PLOF s limitation.   Goal  status: New      PLAN:  PT FREQUENCY: 1-2x/week  PT DURATION: 10 weeks  Can include 96295- PT Re-evaluation, 97110-Therapeutic exercises, 97530- Therapeutic activity, 97112- Neuromuscular re-education, 97535- Self Care, 97140- Manual therapy, [phone]- Gait training, [phone]- Orthotic Fit/training, [phone]- Canalith repositioning, [id number]- Aquatic Therapy, [phone] Electrical stimulation (unattended), [phone]- Electrical stimulation (manual), [id number]- Vasopneumatic device, [id number]- Ultrasound, [id number] Physical performance testing, [id number]- Traction (mechanical), [id number]- Ionotophoresis 4mg /ml Dexamethasone, Patient/Family education, Balance training, Stair training, Taping, Dry Needling, Joint mobilization, Joint manipulation, Spinal manipulation, Spinal mobilization, Scar mobilization, Vestibular training, Visual/preceptual remediation/compensation, DME instructions, Cryotherapy, and Moist heat.  All performed as medically necessary.  All included unless contraindicated  PLAN FOR NEXT SESSION: Check HEP use/response.  Trigger point treatment as necessary.  Progressive postural activation/endurance improvements.     Bonna Bustard, PT, DPT, OCS, ATC 12/16/23  4:16 PM

## 2023-12-16 ENCOUNTER — Ambulatory Visit: Admitting: Rehabilitative and Restorative Service Providers"

## 2023-12-16 ENCOUNTER — Encounter: Payer: Self-pay | Admitting: Rehabilitative and Restorative Service Providers"

## 2023-12-16 DIAGNOSIS — R293 Abnormal posture: Secondary | ICD-10-CM | POA: Diagnosis not present

## 2023-12-16 DIAGNOSIS — M542 Cervicalgia: Secondary | ICD-10-CM | POA: Diagnosis not present

## 2023-12-31 DIAGNOSIS — R92333 Mammographic heterogeneous density, bilateral breasts: Secondary | ICD-10-CM | POA: Diagnosis not present

## 2023-12-31 DIAGNOSIS — Z1231 Encounter for screening mammogram for malignant neoplasm of breast: Secondary | ICD-10-CM | POA: Diagnosis not present

## 2024-01-01 ENCOUNTER — Encounter: Admitting: Rehabilitative and Restorative Service Providers"

## 2024-01-01 DIAGNOSIS — Z131 Encounter for screening for diabetes mellitus: Secondary | ICD-10-CM | POA: Diagnosis not present

## 2024-01-01 DIAGNOSIS — E7849 Other hyperlipidemia: Secondary | ICD-10-CM | POA: Diagnosis not present

## 2024-01-06 ENCOUNTER — Encounter: Admitting: Rehabilitative and Restorative Service Providers"

## 2024-01-06 NOTE — Therapy (Incomplete)
 OUTPATIENT PHYSICAL THERAPY TREATMENT   Patient Name: Christine [name] MRN: [id number] DOB:12/07/1955, 68 y.o., female Today's Date: 01/06/2024  END OF SESSION:    Past Medical History:  Diagnosis Date   Anxiety disorder    Headache    Low blood pressure 02/23/2015   Malaise and fatigue 07/07/2012   OCD (obsessive compulsive disorder) 05/29/2011   Palpitations 05/29/2011   Past Surgical History:  Procedure Laterality Date   CESAREAN SECTION     NO PAST SURGERIES     Patient Active Problem List   Diagnosis Date Noted   Panic disorder with agoraphobia 08/31/2018   Dysthymia 08/31/2018   Low blood pressure 02/23/2015   Malaise and fatigue 07/07/2012   OCD (obsessive compulsive disorder) 05/29/2011   Palpitations 05/29/2011    PCP: Helyn Lobstein MD  REFERRING PROVIDER: Wilhelmenia Harada, MD  REFERRING DIAG: M54.2 (ICD-10-CM) - Cervicalgia  THERAPY DIAG:  No diagnosis found.  Rationale for Evaluation and Treatment: Rehabilitation  ONSET DATE: Around 11/04/2023  SUBJECTIVE:                                                                                                                                                                                                         SUBJECTIVE STATEMENT: Pt indicated having onset of symptoms in early to middle March. Pt indicated having the flu/illness that then led to sinus infection.  Pt indicated waking one morning with pain with movements and shock /sharp pain noted.  Pt indicated seeing regular MD with checkout with medicine for illness and muscle relaxer for neck.  Pt indicated still getting worsen and led to seeing ortho MD.  Pt indicated getting z pack that seemed to help.  Having some complaints with sleeping and tension noted during the day with activity.  Noticed some crepitus.  Reported prolonged sitting looking down was troublesome.   Pt indicated also having some complaints with Rt shoulder/into arm (was present before neck  pain).   Denied numbness/tingling complaints.   Aaron Aas   PERTINENT HISTORY:  MRI 2018 showe mild disc buldges History of headaches Rt hand dominant  PAIN:  NPRS scale: at worst in last week 8/10, current arrival 0/10 Pain location: neck Pain description: tightness/stiff recently Aggravating factors: increased lifting, carrying, arm use Relieving factors: heat, hot shower, ibuprofen   PRECAUTIONS: None  RED FLAGS: None  WEIGHT BEARING RESTRICTIONS: No  FALLS:  Has patient fallen in last 6 months? No  LIVING ENVIRONMENT: Lives in: House/apartment  OCCUPATION: No job indicated  PLOF: Independent, Rt hand, gym workouts, yard work, Regulatory affairs officer booking,  takes care of grand kids  PATIENT GOALS: Reduce complaints,   Pt indicated wanting to do some things to improve movement/strength.    OBJECTIVE:   PATIENT SURVEYS: Patient-Specific Activity Scoring Scheme  "0" represents "unable to perform." "10" represents "able to perform at prior level. 0 1 2 3 4 5 6 7 8 9  10 (Date and Score)   Activity Eval 12/16/2023    1. Lifting/caring for grandkids  8    2. Sleep  8    3.     4.    5.    Score 8 avg    Total score = sum of the activity scores/number of activities Minimum detectable change (90%CI) for average score = 2 points Minimum detectable change (90%CI) for single activity score = 3 points  COGNITION: 12/16/2023 Overall cognitive status: Within functional limits for tasks assessed  SENSATION: 12/16/2023 [facility]  POSTURE:  12/16/2023 Mild rounded shoulders, FHP  PALPATION: 12/16/2023 Trigger points noted in bilateral upper trap, infraspinatus  Supine cervical downslope passive accessory mobility WFL.    CERVICAL ROM:   ROM AROM (deg) 12/16/2023  Flexion 50  Extension 50  Right lateral flexion   Left lateral flexion   Right rotation 80 c Lt neck tightness  Left rotation 70 c Lt neck tightness   (Blank rows = not tested)  UPPER EXTREMITY MMT:   ROM  Right 12/16/2023 Left 12/16/2023  Shoulder flexion 5/5 5/5  Shoulder extension    Shoulder abduction 5/5 5/5  Shoulder adduction    Shoulder extension    Shoulder internal rotation 5/5 5/5  Shoulder external rotation 5/5 c mild pain 5/5  Elbow flexion    Elbow extension    Wrist flexion    Wrist extension    Wrist ulnar deviation    Wrist radial deviation    Wrist pronation    Wrist supination     (Blank rows = not tested)  UPPER EXTREMITY ROM:  MMT Right 12/16/2023 Left 12/16/2023  Shoulder flexion [facility]  Shoulder extension    Shoulder abduction    Shoulder adduction    Shoulder extension    Shoulder internal rotation    Shoulder external rotation    Middle trapezius    Lower trapezius    Elbow flexion    Elbow extension    Wrist flexion    Wrist extension    Wrist ulnar deviation    Wrist radial deviation    Wrist pronation    Wrist supination    Grip strength     (Blank rows = not tested)  SPECIAL TESTS:  12/16/2023 (-) compression spurlings.  (+) relief c distraction for neck tightness.   FUNCTIONAL TESTS:  12/16/2023 No specific testing.  TODAY'S TREATMENT:                                                                                                       DATE:  01/06/2024 Therex:    HEP instruction/performance c cues for techniques, handout provided.  Trial set performed of each for comprehension and symptom assessment.  See below for exercise list. Additional time spent in review.   Manual Supine cervical distraction intermittent holds.  Seated bilateral upper trap, infraspinatus percussive device with education for use at home for trigger point relief.   Self Care Education on heat/ice use, possible massage usage as desired.  Exercise review with pain monitoring scale for  during or after.    TODAY'S TREATMENT:                                                                                                       DATE:  12/16/2023 Therex:    HEP instruction/performance c cues for techniques, handout provided.  Trial set performed of each for comprehension and symptom assessment.  See below for exercise list. Additional time spent in review.   Manual Supine cervical distraction intermittent holds.  Seated bilateral upper trap, infraspinatus percussive device with education for use at home for trigger point relief.   Self Care Education on heat/ice use, possible massage usage as desired.  Exercise review with pain monitoring scale for during or after.   PATIENT EDUCATION:  12/16/2023 Education details: HEP, POC Person educated: Patient Education method: Explanation, Demonstration, Verbal cues, and Handouts Education comprehension: verbalized understanding, returned demonstration, and verbal cues required  HOME EXERCISE PROGRAM: Access Code: [email or web address] URL: [email or web address] Date: 12/16/2023 Prepared by: Bonna Bustard  Exercises - Supine Cervical Retraction with Towel  - 2-3 x daily - 7 x weekly - 1 sets - 10 reps - 5 hold - Cervical Retraction at Wall  - 2-3 x daily - 7 x weekly - 1 sets - 10 reps - 5 hold - Prone Scapular Retraction  - 1 x daily - 7 x weekly - 1 sets - 10 reps - 5 hold - Prone Scapular Slide with Shoulder Extension  - 1 x daily - 7 x weekly - 1 sets - 10 reps - 5 hold - Prone Scapular Retraction Arms at Side  - 1 x daily - 7 x weekly - 1 sets - 10 reps - 5 hold - Seated Scapular Retraction  - 3-5 x daily - 7 x weekly - 1 sets - 5-10 reps - 3-5 hold - Seated Upper Trapezius Stretch  - 2-3 x daily - 7 x weekly - 1 sets - 5 reps - 15 hold  ASSESSMENT:  CLINICAL IMPRESSION: Patient is a 68 y.o. who comes to clinic with complaints of cervical pain with mobility, strength and movement coordination deficits that impair their  ability to perform usual daily and recreational functional activities without increase difficulty/symptoms at this time.  Patient to benefit from skilled PT services to address impairments and limitations to improve to previous level of function without restriction secondary to condition.    OBJECTIVE IMPAIRMENTS: decreased activity tolerance, decreased coordination, decreased endurance, decreased mobility, decreased ROM, decreased strength, increased fascial restrictions, impaired perceived functional ability, increased muscle spasms, impaired flexibility, impaired UE functional use, improper body mechanics, postural dysfunction, and pain.   ACTIVITY LIMITATIONS: carrying, lifting, bending, sitting, sleeping, reach over head, and caring for others  PARTICIPATION LIMITATIONS: meal prep, cleaning, laundry, interpersonal relationship, community activity, and yard work  PERSONAL FACTORS: no specific factors are also affecting patient's functional outcome.   REHAB POTENTIAL: Good  CLINICAL DECISION MAKING: Stable/uncomplicated  EVALUATION COMPLEXITY: Low   GOALS: Goals reviewed with patient? Yes  SHORT TERM GOALS: (target date for Short term goals are 3 weeks 01/06/2024)  1.Patient will demonstrate independent use of home exercise program to maintain progress from in clinic treatments. Goal status: New  LONG TERM GOALS: (target dates for all long term goals are 10 weeks  02/24/2024)   1. Patient will demonstrate/report pain at worst less than or equal to 2/10 to facilitate minimal limitation in daily activity secondary to pain symptoms. Goal status: New   2. Patient will demonstrate independent use of home exercise program to facilitate ability to maintain/progress functional gains from skilled physical therapy services. Goal status: New   3. Patient will demonstrate Patient specific functional scale avg > or = 10 to indicate reduced disability due to condition.  Goal status: New   4.   Patient will demonstrate cervical AROM WFL s symptoms to facilitate usual head movements for daily activity including driving, self care.   Goal status: New   5.  Patient will demonstrate/report ability to perform usual workouts at PLOF s limitation.   Goal status: New      PLAN:  PT FREQUENCY: 1-2x/week  PT DURATION: 10 weeks  Can include 13244- PT Re-evaluation, 97110-Therapeutic exercises, 97530- Therapeutic activity, 97112- Neuromuscular re-education, 97535- Self Care, 97140- Manual therapy, [phone]- Gait training, [phone]- Orthotic Fit/training, [phone]- Canalith repositioning, [id number]- Aquatic Therapy, [phone] Electrical stimulation (unattended), [phone]- Electrical stimulation (manual), [id number]- Vasopneumatic device, [id number]- Ultrasound, [id number] Physical performance testing, [id number]- Traction (mechanical), [id number]- Ionotophoresis 4mg /ml Dexamethasone, Patient/Family education, Balance training, Stair training, Taping, Dry Needling, Joint mobilization, Joint manipulation, Spinal manipulation, Spinal mobilization, Scar mobilization, Vestibular training, Visual/preceptual remediation/compensation, DME instructions, Cryotherapy, and Moist heat.  All performed as medically necessary.  All included unless contraindicated  PLAN FOR NEXT SESSION: Check HEP use/response.  Trigger point treatment as necessary.  Progressive postural activation/endurance improvements.    Bonna Bustard, PT, DPT, OCS, ATC 01/06/24  7:52 AM

## 2024-01-15 ENCOUNTER — Encounter: Admitting: Rehabilitative and Restorative Service Providers"

## 2024-01-25 ENCOUNTER — Encounter: Admitting: Rehabilitative and Restorative Service Providers"

## 2024-01-26 DIAGNOSIS — N951 Menopausal and female climacteric states: Secondary | ICD-10-CM | POA: Diagnosis not present

## 2024-01-26 DIAGNOSIS — F411 Generalized anxiety disorder: Secondary | ICD-10-CM | POA: Diagnosis not present

## 2024-01-26 DIAGNOSIS — E785 Hyperlipidemia, unspecified: Secondary | ICD-10-CM | POA: Diagnosis not present

## 2024-02-25 DIAGNOSIS — Z6823 Body mass index (BMI) 23.0-23.9, adult: Secondary | ICD-10-CM | POA: Diagnosis not present

## 2024-02-25 DIAGNOSIS — J40 Bronchitis, not specified as acute or chronic: Secondary | ICD-10-CM | POA: Diagnosis not present

## 2024-02-25 DIAGNOSIS — J329 Chronic sinusitis, unspecified: Secondary | ICD-10-CM | POA: Diagnosis not present

## 2024-02-27 DIAGNOSIS — J329 Chronic sinusitis, unspecified: Secondary | ICD-10-CM | POA: Diagnosis not present

## 2024-02-27 DIAGNOSIS — J40 Bronchitis, not specified as acute or chronic: Secondary | ICD-10-CM | POA: Diagnosis not present

## 2024-03-02 DIAGNOSIS — J42 Unspecified chronic bronchitis: Secondary | ICD-10-CM | POA: Diagnosis not present

## 2024-03-02 DIAGNOSIS — Z6823 Body mass index (BMI) 23.0-23.9, adult: Secondary | ICD-10-CM | POA: Diagnosis not present

## 2024-03-02 DIAGNOSIS — B9689 Other specified bacterial agents as the cause of diseases classified elsewhere: Secondary | ICD-10-CM | POA: Diagnosis not present

## 2024-03-28 DIAGNOSIS — S60562A Insect bite (nonvenomous) of left hand, initial encounter: Secondary | ICD-10-CM | POA: Diagnosis not present

## 2024-03-28 DIAGNOSIS — R600 Localized edema: Secondary | ICD-10-CM | POA: Diagnosis not present

## 2024-06-15 DIAGNOSIS — Z6823 Body mass index (BMI) 23.0-23.9, adult: Secondary | ICD-10-CM | POA: Diagnosis not present

## 2024-06-15 DIAGNOSIS — M25511 Pain in right shoulder: Secondary | ICD-10-CM | POA: Diagnosis not present

## 2024-06-15 DIAGNOSIS — M25562 Pain in left knee: Secondary | ICD-10-CM | POA: Diagnosis not present

## 2024-06-22 DIAGNOSIS — M25511 Pain in right shoulder: Secondary | ICD-10-CM | POA: Diagnosis not present

## 2024-06-27 ENCOUNTER — Encounter: Payer: Self-pay | Admitting: Radiology

## 2024-06-27 ENCOUNTER — Ambulatory Visit (INDEPENDENT_AMBULATORY_CARE_PROVIDER_SITE_OTHER): Payer: Medicare (Managed Care)

## 2024-06-27 ENCOUNTER — Ambulatory Visit (HOSPITAL_BASED_OUTPATIENT_CLINIC_OR_DEPARTMENT_OTHER): Payer: Medicare (Managed Care) | Admitting: Student

## 2024-06-27 DIAGNOSIS — M1711 Unilateral primary osteoarthritis, right knee: Secondary | ICD-10-CM

## 2024-06-27 DIAGNOSIS — M25561 Pain in right knee: Secondary | ICD-10-CM

## 2024-06-27 DIAGNOSIS — M25562 Pain in left knee: Secondary | ICD-10-CM | POA: Diagnosis not present

## 2024-06-27 NOTE — Progress Notes (Unsigned)
 Chief Complaint: Left knee pain    Discussed the use of AI scribe software for clinical note transcription with the patient, who gave verbal consent to proceed.  History of Present Illness Christine [name] is a 68 year old female with knee bursitis who presents with persistent knee pain and swelling.  She has experienced knee pain and swelling for the past couple of months, with worsening symptoms over the last few weeks. The pain is significant, particularly when moving from a squatting position, and is located on the inside of the knee. It is associated with puffiness and occasional popping and clicking.  She has a history of bursitis in the same knee and recalls a past MRI showing a meniscus tear and a Baker's cyst, without surgical intervention. Recently, she completed a ten-day course of Celebrex (celecoxib) 100 mg, which provided some relief.  There is no recent injury or trauma to the knee. The pain is debilitating, affecting her ability to perform daily activities. Movement exacerbates the pain, while rest provides some relief. She has not experienced fever or signs of infection.      Surgical History:   None  PMH/PSH/Family History/Social History/Meds/Allergies:    Past Medical History:  Diagnosis Date   Anxiety disorder    Headache    Low blood pressure 02/23/2015   Malaise and fatigue 07/07/2012   OCD (obsessive compulsive disorder) 05/29/2011   Palpitations 05/29/2011   Past Surgical History:  Procedure Laterality Date   CESAREAN SECTION     NO PAST SURGERIES     Social History   Socioeconomic History   Marital status: Married    Spouse name: Not on file   Number of children: 3   Years of education: Masters    Highest education level: Not on file  Occupational History   Occupation: N/A  Tobacco Use   Smoking status: Never   Smokeless tobacco: Never  Substance and Sexual Activity   Alcohol use: Yes    Comment: Occ   Drug  use: No   Sexual activity: Not on file  Other Topics Concern   Not on file  Social History Narrative   Rare caffeine use    Social Drivers of Health   Financial Resource Strain: Not on file  Food Insecurity: Not on file  Transportation Needs: Not on file  Physical Activity: Not on file  Stress: Not on file  Social Connections: Unknown (12/22/2021)   Received from [facility]   Social Network    Social Network: Not on file   Family History  Problem Relation Age of Onset   Alzheimer's disease Mother    Pulmonary embolism Father    Anxiety disorder Sister    Diabetes Maternal Grandmother    Parkinson's disease Maternal Grandmother    Allergies  Allergen Reactions   Iodine Hives    Topical/ hives   Current Outpatient Medications  Medication Sig Dispense Refill   aspirin EC 81 MG tablet Take 81 mg by mouth daily.     azelastine (ASTELIN) 0.1 % nasal spray azelastine 137 mcg (0.1 %) nasal spray aerosol  USE 1 SPRAY IN EACH NOSTRIL TWICE A DAY     FLUoxetine  (PROZAC ) 20 MG capsule Take 3 capsules (60 mg total) by mouth daily. 270 capsule 3   fluticasone (FLONASE) 50 MCG/ACT nasal spray  Place 1 spray into both nostrils daily.     L-Methylfolate 15 MG TABS Take 1 tablet (15 mg total) by mouth daily. 90 tablet 3   loratadine (CLARITIN) 10 MG tablet Take 10 mg by mouth daily as needed for allergies.     LORazepam  (ATIVAN ) 0.5 MG tablet Take 1 tablet (0.5 mg total) by mouth every 8 (eight) hours as needed for anxiety. (Patient not taking: Reported on 11/18/2023) 30 tablet 0   Methylfol-Methylcob-Acetylcyst (CEREFOLIN NAC) 6-2-600 MG TABS Take 1 tablet by mouth daily. (Patient not taking: Reported on 06/27/2024) 90 tablet 3   rosuvastatin  (CRESTOR ) 10 MG tablet Take 10 mg by mouth daily.     Vitamin D, Cholecalciferol, 50 MCG (2000 UT) CAPS Take 1 tablet by mouth daily.     No current facility-administered medications for this visit.   No results found.  Review of Systems:   A  ROS was performed including pertinent positives and negatives as documented in the HPI.  Physical Exam :   Constitutional: NAD and appears stated age Neurological: Alert and oriented Psych: Appropriate affect and cooperative There were no vitals taken for this visit.   Comprehensive Musculoskeletal Exam:    Exam of the left knee demonstrates tenderness over bilateral joint lines.  Small effusion present without overlying erythema or warmth.  Patient is ambulating cautiously with antalgic gait.  No laxity appreciated with varus or valgus stress.  Imaging:   Xray (left knee 4 views): Mild to moderate tricompartmental osteoarthritis with diffuse osteophytes, medial joint space narrowing and sclerosis, and patellofemoral osteophyte formation.   I personally reviewed and interpreted the radiographs.      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 MRI and X-rays show moderate osteoarthritis with cartilage loss and bone spurs. Administered a steroid injection to the left knee. Advised ibuprofen for evening pain management and recommended icing the knee post-injection. Instructed to return if significant pain persists or worsens.      Procedure Note  Patient: Christine [name]             Date of Birth: 1956-05-15           MRN: [id number]             Visit Date: 06/27/2024  Procedures: Visit Diagnoses:  1. Acute pain of left knee     Large Joint Inj: L knee on 06/27/2024 5:30 PM Indications: pain Details: 22 G 1.5 in needle, anterolateral approach Medications: 4 mL lidocaine 1 %; 2 mL triamcinolone acetonide 40 MG/ML Outcome: tolerated well, no immediate complications Procedure, treatment alternatives, risks and benefits explained, specific risks discussed. Consent was given by the patient. Immediately prior to procedure a time out was called to  verify the correct patient, procedure, equipment, support staff and site/side marked as required. Patient was prepped and draped in the usual sterile fashion.       I personally saw and evaluated the patient, and participated in the management and treatment plan.  Leonce Reveal, PA-C Orthopedics

## 2024-06-28 MED ORDER — LIDOCAINE HCL 1 % IJ SOLN
4.0000 mL | INTRAMUSCULAR | Status: AC | PRN
  Administered 2024-06-27: 4 mL

## 2024-06-28 MED ORDER — TRIAMCINOLONE ACETONIDE 40 MG/ML IJ SUSP
2.0000 mL | INTRAMUSCULAR | Status: AC | PRN
  Administered 2024-06-27: 2 mL via INTRA_ARTICULAR

## 2024-07-01 DIAGNOSIS — R5383 Other fatigue: Secondary | ICD-10-CM | POA: Diagnosis not present

## 2024-07-01 DIAGNOSIS — R52 Pain, unspecified: Secondary | ICD-10-CM | POA: Diagnosis not present

## 2024-07-06 DIAGNOSIS — M25562 Pain in left knee: Secondary | ICD-10-CM | POA: Diagnosis not present

## 2024-07-06 DIAGNOSIS — M25512 Pain in left shoulder: Secondary | ICD-10-CM | POA: Diagnosis not present

## 2024-07-06 DIAGNOSIS — M25511 Pain in right shoulder: Secondary | ICD-10-CM | POA: Diagnosis not present

## 2024-07-08 DIAGNOSIS — M2550 Pain in unspecified joint: Secondary | ICD-10-CM | POA: Diagnosis not present

## 2024-08-01 DIAGNOSIS — K080 Exfoliation of teeth due to systemic causes: Secondary | ICD-10-CM | POA: Diagnosis not present

## 2024-08-10 DIAGNOSIS — K080 Exfoliation of teeth due to systemic causes: Secondary | ICD-10-CM | POA: Diagnosis not present

## 2024-08-11 DIAGNOSIS — M25511 Pain in right shoulder: Secondary | ICD-10-CM | POA: Diagnosis not present

## 2024-11-17 ENCOUNTER — Ambulatory Visit: Admitting: Psychiatry
